# Patient Record
Sex: Male | Born: 1995 | Race: Black or African American | Hispanic: No | Marital: Single | Smoking: Current some day smoker
Health system: Southern US, Community
[De-identification: ages and names within clinical notes are randomized; demographics above are authoritative.]

## PROBLEM LIST (undated history)

## (undated) DIAGNOSIS — Z9889 Other specified postprocedural states: Secondary | ICD-10-CM

## (undated) DIAGNOSIS — R112 Nausea with vomiting, unspecified: Secondary | ICD-10-CM

## (undated) DIAGNOSIS — T8859XA Other complications of anesthesia, initial encounter: Secondary | ICD-10-CM

## (undated) DIAGNOSIS — Z945 Skin transplant status: Secondary | ICD-10-CM

## (undated) DIAGNOSIS — T4145XA Adverse effect of unspecified anesthetic, initial encounter: Secondary | ICD-10-CM

## (undated) DIAGNOSIS — J45909 Unspecified asthma, uncomplicated: Secondary | ICD-10-CM

---

## 2016-02-22 ENCOUNTER — Emergency Department (HOSPITAL_COMMUNITY): Payer: Medicaid Other

## 2016-02-22 ENCOUNTER — Inpatient Hospital Stay (HOSPITAL_COMMUNITY)
Admission: EM | Admit: 2016-02-22 | Discharge: 2016-02-25 | DRG: 493 | Disposition: A | Discharge status: Home / Self Care | Payer: Medicaid Other | Attending: Orthopedic Surgery | Admitting: Orthopedic Surgery

## 2016-02-22 ENCOUNTER — Encounter (HOSPITAL_COMMUNITY): Payer: Self-pay | Admitting: *Deleted

## 2016-02-22 DIAGNOSIS — D62 Acute posthemorrhagic anemia: Secondary | ICD-10-CM | POA: Diagnosis not present

## 2016-02-22 DIAGNOSIS — S82201B Unspecified fracture of shaft of right tibia, initial encounter for open fracture type I or II: Principal | ICD-10-CM | POA: Diagnosis present

## 2016-02-22 DIAGNOSIS — S82401B Unspecified fracture of shaft of right fibula, initial encounter for open fracture type I or II: Secondary | ICD-10-CM

## 2016-02-22 DIAGNOSIS — Z79899 Other long term (current) drug therapy: Secondary | ICD-10-CM

## 2016-02-22 DIAGNOSIS — S82201K Unspecified fracture of shaft of right tibia, subsequent encounter for closed fracture with nonunion: Secondary | ICD-10-CM

## 2016-02-22 DIAGNOSIS — W1789XA Other fall from one level to another, initial encounter: Secondary | ICD-10-CM | POA: Diagnosis present

## 2016-02-22 DIAGNOSIS — F17200 Nicotine dependence, unspecified, uncomplicated: Secondary | ICD-10-CM | POA: Diagnosis present

## 2016-02-22 DIAGNOSIS — Z419 Encounter for procedure for purposes other than remedying health state, unspecified: Secondary | ICD-10-CM

## 2016-02-22 DIAGNOSIS — S82209A Unspecified fracture of shaft of unspecified tibia, initial encounter for closed fracture: Secondary | ICD-10-CM | POA: Diagnosis present

## 2016-02-22 HISTORY — DX: Unspecified asthma, uncomplicated: J45.909

## 2016-02-22 HISTORY — DX: Adverse effect of unspecified anesthetic, initial encounter: T41.45XA

## 2016-02-22 HISTORY — DX: Skin transplant status: Z94.5

## 2016-02-22 HISTORY — DX: Other complications of anesthesia, initial encounter: T88.59XA

## 2016-02-22 HISTORY — DX: Other specified postprocedural states: Z98.890

## 2016-02-22 HISTORY — DX: Nausea with vomiting, unspecified: R11.2

## 2016-02-22 LAB — CBC WITH DIFFERENTIAL/PLATELET
BASOS ABS: 0 10*3/uL (ref 0.0–0.1)
BASOS PCT: 0 %
EOS ABS: 0 10*3/uL (ref 0.0–0.7)
Eosinophils Relative: 0 %
HEMATOCRIT: 40.5 % (ref 39.0–52.0)
HEMOGLOBIN: 13.9 g/dL (ref 13.0–17.0)
Lymphocytes Relative: 19 %
Lymphs Abs: 1.5 10*3/uL (ref 0.7–4.0)
MCH: 29.7 pg (ref 26.0–34.0)
MCHC: 34.3 g/dL (ref 30.0–36.0)
MCV: 86.5 fL (ref 78.0–100.0)
Monocytes Absolute: 0.7 10*3/uL (ref 0.1–1.0)
Monocytes Relative: 9 %
NEUTROS ABS: 5.5 10*3/uL (ref 1.7–7.7)
NEUTROS PCT: 72 %
Platelets: 227 10*3/uL (ref 150–400)
RBC: 4.68 MIL/uL (ref 4.22–5.81)
RDW: 12.6 % (ref 11.5–15.5)
WBC: 7.7 10*3/uL (ref 4.0–10.5)

## 2016-02-22 LAB — BASIC METABOLIC PANEL
ANION GAP: 6 (ref 5–15)
BUN: <5 mg/dL — ABNORMAL LOW (ref 6–20)
CALCIUM: 8.9 mg/dL (ref 8.9–10.3)
CO2: 28 mmol/L (ref 22–32)
Chloride: 102 mmol/L (ref 101–111)
Creatinine, Ser: 1.19 mg/dL (ref 0.61–1.24)
Glucose, Bld: 119 mg/dL — ABNORMAL HIGH (ref 65–99)
Potassium: 3.2 mmol/L — ABNORMAL LOW (ref 3.5–5.1)
SODIUM: 136 mmol/L (ref 135–145)

## 2016-02-22 MED ORDER — CEFAZOLIN SODIUM 1-5 GM-% IV SOLN
1.0000 g | Freq: Once | INTRAVENOUS | Status: AC
  Administered 2016-02-22: 1 g via INTRAVENOUS
  Filled 2016-02-22: qty 50

## 2016-02-22 MED ORDER — SODIUM CHLORIDE 0.9 % IV BOLUS (SEPSIS)
1000.0000 mL | Freq: Once | INTRAVENOUS | Status: AC
  Administered 2016-02-22: 1000 mL via INTRAVENOUS

## 2016-02-22 MED ORDER — HYDROMORPHONE HCL 1 MG/ML IJ SOLN
1.0000 mg | Freq: Once | INTRAMUSCULAR | Status: AC
  Administered 2016-02-22: 1 mg via INTRAVENOUS
  Filled 2016-02-22: qty 1

## 2016-02-22 MED ORDER — TETANUS-DIPHTH-ACELL PERTUSSIS 5-2.5-18.5 LF-MCG/0.5 IM SUSP
0.5000 mL | Freq: Once | INTRAMUSCULAR | Status: AC
  Administered 2016-02-22: 0.5 mL via INTRAMUSCULAR
  Filled 2016-02-22: qty 0.5
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
## 2016-02-22 NOTE — Progress Notes (Signed)
Orthopedic Tech Progress Note Patient Details:  Gregory MulberryJashaun Huerta 16-Nov-1995 [id number]  Ortho Devices Type of Ortho Device: Huerta (long leg) splint Ortho Device/Splint Location: rle Huerta long leg splint Ortho Device/Splint Interventions: Ordered, Application Assisted dr to apply posterior long leg splint. Dr stated stirrups not neccessary.   Gregory PostMartinez, Lesleyanne Politte J 02/22/2016, 11:16 PM
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
## 2016-02-23 ENCOUNTER — Inpatient Hospital Stay (HOSPITAL_COMMUNITY): Payer: Medicaid Other

## 2016-02-23 ENCOUNTER — Inpatient Hospital Stay (HOSPITAL_COMMUNITY): Payer: Medicaid Other | Admitting: Certified Registered Nurse Anesthetist

## 2016-02-23 ENCOUNTER — Encounter (HOSPITAL_COMMUNITY)
Admission: EM | Disposition: A | Discharge status: Home / Self Care | Payer: Self-pay | Source: Home / Self Care | Attending: Orthopedic Surgery

## 2016-02-23 DIAGNOSIS — S82209A Unspecified fracture of shaft of unspecified tibia, initial encounter for closed fracture: Secondary | ICD-10-CM | POA: Diagnosis present

## 2016-02-23 HISTORY — PX: TIBIA IM NAIL INSERTION: SHX2516

## 2016-02-23 HISTORY — PX: IM NAILING TIBIA: SUR734

## 2016-02-23 HISTORY — PX: INCISION AND DRAINAGE OF WOUND: SHX1803

## 2016-02-23 LAB — SURGICAL PCR SCREEN
MRSA, PCR: NEGATIVE
STAPHYLOCOCCUS AUREUS: NEGATIVE

## 2016-02-23 SURGERY — INSERTION, INTRAMEDULLARY ROD, TIBIA
Anesthesia: General | Site: Leg Lower | Laterality: Right

## 2016-02-23 MED ORDER — LACTATED RINGERS IV SOLN
INTRAVENOUS | Status: DC | PRN
  Administered 2016-02-23 (×2): via INTRAVENOUS

## 2016-02-23 MED ORDER — ACETAMINOPHEN 325 MG PO TABS: 650.0000 mg | ORAL_TABLET | Freq: Four times a day (QID) | ORAL | Status: DC | PRN

## 2016-02-23 MED ORDER — DIPHENHYDRAMINE HCL 12.5 MG/5ML PO ELIX: 12.5000 mg | ORAL_SOLUTION | ORAL | Status: DC | PRN

## 2016-02-23 MED ORDER — ROCURONIUM BROMIDE 100 MG/10ML IV SOLN
INTRAVENOUS | Status: DC | PRN
  Administered 2016-02-23: 10 mg via INTRAVENOUS
  Administered 2016-02-23: 50 mg via INTRAVENOUS
  Administered 2016-02-23: 20 mg via INTRAVENOUS

## 2016-02-23 MED ORDER — CEFAZOLIN SODIUM 1-5 GM-% IV SOLN
1.0000 g | Freq: Four times a day (QID) | INTRAVENOUS | Status: AC
  Administered 2016-02-23 – 2016-02-24 (×3): 1 g via INTRAVENOUS
  Filled 2016-02-23 (×3): qty 50

## 2016-02-23 MED ORDER — PROPOFOL 10 MG/ML IV BOLUS
INTRAVENOUS | Status: AC
  Filled 2016-02-23: qty 20

## 2016-02-23 MED ORDER — FENTANYL CITRATE (PF) 100 MCG/2ML IJ SOLN
INTRAMUSCULAR | Status: DC | PRN
  Administered 2016-02-23 (×3): 50 ug via INTRAVENOUS
  Administered 2016-02-23: 100 ug via INTRAVENOUS

## 2016-02-23 MED ORDER — POLYETHYLENE GLYCOL 3350 17 G PO PACK: 17.0000 g | PACK | Freq: Every day | ORAL | Status: DC | PRN

## 2016-02-23 MED ORDER — ACETAMINOPHEN 650 MG RE SUPP
650.0000 mg | Freq: Four times a day (QID) | RECTAL | Status: DC | PRN
Start: 2016-02-23 — End: 2016-02-23

## 2016-02-23 MED ORDER — CEFAZOLIN SODIUM-DEXTROSE 2-4 GM/100ML-% IV SOLN
2.0000 g | Freq: Three times a day (TID) | INTRAVENOUS | Status: AC
  Administered 2016-02-23: 2 g via INTRAVENOUS
  Filled 2016-02-23: qty 100

## 2016-02-23 MED ORDER — DEXTROSE-NACL 5-0.45 % IV SOLN: 100.0000 mL/h | INTRAVENOUS | Status: DC

## 2016-02-23 MED ORDER — MIDAZOLAM HCL 2 MG/2ML IJ SOLN
INTRAMUSCULAR | Status: AC
  Filled 2016-02-23: qty 2

## 2016-02-23 MED ORDER — SUGAMMADEX SODIUM 200 MG/2ML IV SOLN
INTRAVENOUS | Status: DC | PRN
  Administered 2016-02-23: 150 mg via INTRAVENOUS

## 2016-02-23 MED ORDER — HYDROMORPHONE HCL 1 MG/ML IJ SOLN
0.2500 mg | INTRAMUSCULAR | Status: DC | PRN
  Administered 2016-02-23 (×2): 0.5 mg via INTRAVENOUS

## 2016-02-23 MED ORDER — ONDANSETRON HCL 4 MG/2ML IJ SOLN
INTRAMUSCULAR | Status: AC
  Filled 2016-02-23: qty 2

## 2016-02-23 MED ORDER — ACETAMINOPHEN 500 MG PO TABS
1000.0000 mg | ORAL_TABLET | Freq: Once | ORAL | Status: DC
  Filled 2016-02-23: qty 2

## 2016-02-23 MED ORDER — SODIUM CHLORIDE 0.9 % IR SOLN
Status: DC | PRN
  Administered 2016-02-23: 3000 mL

## 2016-02-23 MED ORDER — CEFAZOLIN SODIUM-DEXTROSE 2-4 GM/100ML-% IV SOLN
2.0000 g | INTRAVENOUS | Status: AC
  Filled 2016-02-23: qty 100

## 2016-02-23 MED ORDER — MAGNESIUM CITRATE PO SOLN
1.0000 | Freq: Once | ORAL | Status: DC | PRN
Start: 2016-02-23 — End: 2016-02-23

## 2016-02-23 MED ORDER — ONDANSETRON HCL 4 MG/2ML IJ SOLN
INTRAMUSCULAR | Status: DC | PRN
  Administered 2016-02-23: 4 mg via INTRAVENOUS

## 2016-02-23 MED ORDER — SUGAMMADEX SODIUM 200 MG/2ML IV SOLN
INTRAVENOUS | Status: AC
  Filled 2016-02-23: qty 2

## 2016-02-23 MED ORDER — HYDROMORPHONE HCL 1 MG/ML IJ SOLN
1.0000 mg | INTRAMUSCULAR | Status: DC | PRN
  Administered 2016-02-23 (×2): 1 mg via INTRAVENOUS
  Filled 2016-02-23 (×2): qty 1

## 2016-02-23 MED ORDER — ASPIRIN 325 MG PO TABS
325.0000 mg | ORAL_TABLET | Freq: Every day | ORAL | Status: DC
  Administered 2016-02-23 – 2016-02-25 (×3): 325 mg via ORAL
  Filled 2016-02-23 (×4): qty 1

## 2016-02-23 MED ORDER — SODIUM CHLORIDE 0.9 % IV SOLN
INTRAVENOUS | Status: DC
  Administered 2016-02-23 – 2016-02-24 (×2): via INTRAVENOUS

## 2016-02-23 MED ORDER — METOCLOPRAMIDE HCL 5 MG PO TABS
5.0000 mg | ORAL_TABLET | Freq: Three times a day (TID) | ORAL | Status: DC | PRN
  Administered 2016-02-24: 10 mg via ORAL
  Filled 2016-02-23: qty 2

## 2016-02-23 MED ORDER — FENTANYL CITRATE (PF) 250 MCG/5ML IJ SOLN
INTRAMUSCULAR | Status: AC
  Filled 2016-02-23: qty 5

## 2016-02-23 MED ORDER — HYDROMORPHONE HCL 1 MG/ML IJ SOLN
INTRAMUSCULAR | Status: AC
  Filled 2016-02-23: qty 1

## 2016-02-23 MED ORDER — CEFAZOLIN SODIUM-DEXTROSE 2-3 GM-% IV SOLR
INTRAVENOUS | Status: DC | PRN
  Administered 2016-02-23: 2 g via INTRAVENOUS

## 2016-02-23 MED ORDER — METHOCARBAMOL 1000 MG/10ML IJ SOLN
500.0000 mg | Freq: Four times a day (QID) | INTRAVENOUS | Status: DC | PRN
  Filled 2016-02-23: qty 5

## 2016-02-23 MED ORDER — CHLORHEXIDINE GLUCONATE 4 % EX LIQD
60.0000 mL | Freq: Once | CUTANEOUS | Status: DC
  Filled 2016-02-23: qty 60

## 2016-02-23 MED ORDER — ACETAMINOPHEN 500 MG PO TABS
1000.0000 mg | ORAL_TABLET | Freq: Four times a day (QID) | ORAL | Status: AC
  Administered 2016-02-23 – 2016-02-24 (×3): 1000 mg via ORAL
  Filled 2016-02-23 (×2): qty 2

## 2016-02-23 MED ORDER — MIDAZOLAM HCL 5 MG/5ML IJ SOLN
INTRAMUSCULAR | Status: DC | PRN
  Administered 2016-02-23: 2 mg via INTRAVENOUS

## 2016-02-23 MED ORDER — POVIDONE-IODINE 10 % EX SWAB
2.0000 | Freq: Once | CUTANEOUS | Status: DC
Start: 2016-02-23 — End: 2016-02-25

## 2016-02-23 MED ORDER — BISACODYL 10 MG RE SUPP: 10.0000 mg | Freq: Every day | RECTAL | Status: DC | PRN

## 2016-02-23 MED ORDER — OXYCODONE HCL 5 MG PO TABS
5.0000 mg | ORAL_TABLET | ORAL | Status: DC | PRN
  Administered 2016-02-23: 10 mg via ORAL
  Filled 2016-02-23: qty 2

## 2016-02-23 MED ORDER — METOCLOPRAMIDE HCL 5 MG/ML IJ SOLN: 5.0000 mg | Freq: Three times a day (TID) | INTRAMUSCULAR | Status: DC | PRN

## 2016-02-23 MED ORDER — MORPHINE SULFATE (PF) 2 MG/ML IV SOLN
2.0000 mg | INTRAVENOUS | Status: DC | PRN
  Administered 2016-02-23 (×2): 2 mg via INTRAVENOUS
  Filled 2016-02-23 (×3): qty 1

## 2016-02-23 MED ORDER — LIDOCAINE HCL (CARDIAC) 20 MG/ML IV SOLN
INTRAVENOUS | Status: DC | PRN
  Administered 2016-02-23: 80 mg via INTRAVENOUS

## 2016-02-23 MED ORDER — BUPIVACAINE-EPINEPHRINE 0.5% -1:200000 IJ SOLN
INTRAMUSCULAR | Status: DC | PRN
  Administered 2016-02-23: 20 mL

## 2016-02-23 MED ORDER — 0.9 % SODIUM CHLORIDE (POUR BTL) OPTIME
TOPICAL | Status: DC | PRN
  Administered 2016-02-23: 1000 mL

## 2016-02-23 MED ORDER — OXYCODONE HCL 5 MG PO TABS
5.0000 mg | ORAL_TABLET | ORAL | Status: DC | PRN
  Administered 2016-02-23 – 2016-02-24 (×3): 10 mg via ORAL
  Filled 2016-02-23 (×4): qty 2

## 2016-02-23 MED ORDER — ACETAMINOPHEN 650 MG RE SUPP: 650.0000 mg | Freq: Four times a day (QID) | RECTAL | Status: DC | PRN

## 2016-02-23 MED ORDER — DOCUSATE SODIUM 100 MG PO CAPS
100.0000 mg | ORAL_CAPSULE | Freq: Two times a day (BID) | ORAL | Status: DC
  Administered 2016-02-23 – 2016-02-25 (×4): 100 mg via ORAL
  Filled 2016-02-23 (×4): qty 1

## 2016-02-23 MED ORDER — KETOROLAC TROMETHAMINE 15 MG/ML IJ SOLN
15.0000 mg | Freq: Four times a day (QID) | INTRAMUSCULAR | Status: DC
  Administered 2016-02-23 – 2016-02-24 (×3): 15 mg via INTRAVENOUS
  Filled 2016-02-23 (×4): qty 1

## 2016-02-23 MED ORDER — SENNA 8.6 MG PO TABS
1.0000 | ORAL_TABLET | Freq: Two times a day (BID) | ORAL | Status: DC
  Administered 2016-02-23 – 2016-02-25 (×3): 8.6 mg via ORAL
  Filled 2016-02-23 (×4): qty 1

## 2016-02-23 MED ORDER — METHOCARBAMOL 500 MG PO TABS
500.0000 mg | ORAL_TABLET | Freq: Four times a day (QID) | ORAL | Status: DC | PRN
  Administered 2016-02-23 – 2016-02-24 (×4): 500 mg via ORAL
  Filled 2016-02-23 (×4): qty 1

## 2016-02-23 MED ORDER — ONDANSETRON HCL 4 MG/2ML IJ SOLN
4.0000 mg | Freq: Four times a day (QID) | INTRAMUSCULAR | Status: DC | PRN
  Administered 2016-02-24: 4 mg via INTRAVENOUS
  Filled 2016-02-23: qty 2

## 2016-02-23 MED ORDER — PROPOFOL 10 MG/ML IV BOLUS
INTRAVENOUS | Status: DC | PRN
  Administered 2016-02-23: 150 mg via INTRAVENOUS

## 2016-02-23 MED ORDER — ZOLPIDEM TARTRATE 5 MG PO TABS: 5.0000 mg | ORAL_TABLET | Freq: Every evening | ORAL | Status: DC | PRN

## 2016-02-23 MED ORDER — ASPIRIN 325 MG PO TABS
325.0000 mg | ORAL_TABLET | Freq: Every day | ORAL | Status: DC
  Filled 2016-02-23: qty 1

## 2016-02-23 MED ORDER — ONDANSETRON HCL 4 MG PO TABS: 4.0000 mg | ORAL_TABLET | Freq: Four times a day (QID) | ORAL | Status: DC | PRN

## 2016-02-23 SURGICAL SUPPLY — 78 items
BANDAGE ELASTIC 4 VELCRO ST LF (GAUZE/BANDAGES/DRESSINGS) ×3 IMPLANT
BANDAGE ELASTIC 6 VELCRO ST LF (GAUZE/BANDAGES/DRESSINGS) ×3 IMPLANT
BIT DRILL AO GAMMA 4.2X130 (BIT) ×3 IMPLANT
BIT DRILL AO GAMMA 4.2X340 (BIT) ×3 IMPLANT
BLADE SURG 10 STRL SS (BLADE) IMPLANT
BNDG COHESIVE 4X5 TAN STRL (GAUZE/BANDAGES/DRESSINGS) IMPLANT
BNDG GAUZE ELAST 4 BULKY (GAUZE/BANDAGES/DRESSINGS) IMPLANT
CHLORAPREP W/TINT 26ML (MISCELLANEOUS) ×3 IMPLANT
COVER MAYO STAND STRL (DRAPES) IMPLANT
COVER SURGICAL LIGHT HANDLE (MISCELLANEOUS) ×3 IMPLANT
CUFF TOURNIQUET SINGLE 34IN LL (TOURNIQUET CUFF) IMPLANT
CUFF TOURNIQUET SINGLE 44IN (TOURNIQUET CUFF) IMPLANT
DRAPE C-ARM 42X72 X-RAY (DRAPES) ×3 IMPLANT
DRAPE C-ARMOR (DRAPES) ×3 IMPLANT
DRAPE IMP U-DRAPE 54X76 (DRAPES) ×3 IMPLANT
DRAPE PROXIMA HALF (DRAPES) ×3 IMPLANT
DRESSING OPSITE X SMALL 2X3 (GAUZE/BANDAGES/DRESSINGS) IMPLANT
DRSG ADAPTIC 3X8 NADH LF (GAUZE/BANDAGES/DRESSINGS) IMPLANT
DRSG EMULSION OIL 3X3 NADH (GAUZE/BANDAGES/DRESSINGS) ×3 IMPLANT
DRSG MEPITEL 3X4 ME34 (GAUZE/BANDAGES/DRESSINGS) ×3 IMPLANT
DRSG PAD ABDOMINAL 8X10 ST (GAUZE/BANDAGES/DRESSINGS) ×3 IMPLANT
DRSG TEGADERM 4X4.75 (GAUZE/BANDAGES/DRESSINGS) IMPLANT
ELECT CAUTERY BLADE 6.4 (BLADE) IMPLANT
ELECT REM PT RETURN 9FT ADLT (ELECTROSURGICAL) ×3
ELECTRODE REM PT RTRN 9FT ADLT (ELECTROSURGICAL) ×1 IMPLANT
GAUZE SPONGE 4X4 12PLY STRL (GAUZE/BANDAGES/DRESSINGS) ×3 IMPLANT
GAUZE XEROFORM 1X8 LF (GAUZE/BANDAGES/DRESSINGS) ×3 IMPLANT
GLOVE BIO SURGEON STRL SZ7 (GLOVE) IMPLANT
GLOVE BIO SURGEON STRL SZ7.5 (GLOVE) ×6 IMPLANT
GLOVE BIO SURGEON STRL SZ8.5 (GLOVE) ×3 IMPLANT
GLOVE BIOGEL PI IND STRL 7.0 (GLOVE) ×1 IMPLANT
GLOVE BIOGEL PI IND STRL 7.5 (GLOVE) ×1 IMPLANT
GLOVE BIOGEL PI IND STRL 8 (GLOVE) ×2 IMPLANT
GLOVE BIOGEL PI INDICATOR 7.0 (GLOVE) ×2
GLOVE BIOGEL PI INDICATOR 7.5 (GLOVE) ×2
GLOVE BIOGEL PI INDICATOR 8 (GLOVE) ×4
GLOVE SURG SS PI 6.5 STRL IVOR (GLOVE) ×3 IMPLANT
GOWN STRL REUS W/ TWL LRG LVL3 (GOWN DISPOSABLE) ×4 IMPLANT
GOWN STRL REUS W/TWL LRG LVL3 (GOWN DISPOSABLE) ×8
GUIDEWIRE GAMMA (WIRE) ×3 IMPLANT
GUIDEWIRE GAMMA 800 (WIRE) ×3 IMPLANT
KIT BASIN OR (CUSTOM PROCEDURE TRAY) ×3 IMPLANT
KIT ROOM TURNOVER OR (KITS) ×3 IMPLANT
MANIFOLD NEPTUNE II (INSTRUMENTS) ×3 IMPLANT
NAIL TIBIAL STND 9X375MM (Nail) ×3 IMPLANT
NAIL TIBIALSTD 9X375MM (Nail) ×1 IMPLANT
NEEDLE 22X1 1/2 (OR ONLY) (NEEDLE) ×3 IMPLANT
NS IRRIG 1000ML POUR BTL (IV SOLUTION) ×3 IMPLANT
PACK GENERAL/GYN (CUSTOM PROCEDURE TRAY) ×3 IMPLANT
PACK ORTHO EXTREMITY (CUSTOM PROCEDURE TRAY) ×3 IMPLANT
PACK UNIVERSAL I (CUSTOM PROCEDURE TRAY) IMPLANT
PAD ARMBOARD 7.5X6 YLW CONV (MISCELLANEOUS) ×6 IMPLANT
PAD CAST 4YDX4 CTTN HI CHSV (CAST SUPPLIES) IMPLANT
PADDING CAST COTTON 4X4 STRL (CAST SUPPLIES)
REAMER SHAFT BIXCUT (INSTRUMENTS) ×3 IMPLANT
SCREW GAMMA (Screw) ×3 IMPLANT
SCREW LOCKING T2 F/T  5MMX50MM (Screw) ×2 IMPLANT
SCREW LOCKING T2 F/T  5X37.5MM (Screw) ×2 IMPLANT
SCREW LOCKING T2 F/T  5X42.5MM (Screw) ×2 IMPLANT
SCREW LOCKING T2 F/T 5MMX50MM (Screw) ×1 IMPLANT
SCREW LOCKING T2 F/T 5X37.5MM (Screw) ×1 IMPLANT
SCREW LOCKING T2 F/T 5X42.5MM (Screw) ×1 IMPLANT
SET CYSTO W/LG BORE CLAMP LF (SET/KITS/TRAYS/PACK) ×3 IMPLANT
SPONGE LAP 18X18 X RAY DECT (DISPOSABLE) IMPLANT
STAPLER VISISTAT 35W (STAPLE) IMPLANT
SUT ETHILON 3 0 PS 1 (SUTURE) ×9 IMPLANT
SUT MNCRL AB 4-0 PS2 18 (SUTURE) IMPLANT
SUT MON AB 2-0 CT1 27 (SUTURE) IMPLANT
SUT VIC AB 0 CT1 27 (SUTURE) ×2
SUT VIC AB 0 CT1 27XBRD ANBCTR (SUTURE) ×1 IMPLANT
SYR BULB IRRIGATION 50ML (SYRINGE) IMPLANT
SYR CONTROL 10ML LL (SYRINGE) ×3 IMPLANT
TOWEL OR 17X24 6PK STRL BLUE (TOWEL DISPOSABLE) ×3 IMPLANT
TOWEL OR 17X26 10 PK STRL BLUE (TOWEL DISPOSABLE) ×3 IMPLANT
TOWEL OR NON WOVEN STRL DISP B (DISPOSABLE) IMPLANT
TUBE CONNECTING 12'X1/4 (SUCTIONS)
TUBE CONNECTING 12X1/4 (SUCTIONS) IMPLANT
YANKAUER SUCT BULB TIP NO VENT (SUCTIONS) ×3 IMPLANT

## 2016-02-23 NOTE — Anesthesia Preprocedure Evaluation (Addendum)
Anesthesia Evaluation  Patient identified by MRN, date of birth, ID band Patient awake    Reviewed: Allergy & Precautions, H&P , NPO status , Patient's Chart, lab work & pertinent test results  Airway Mallampati: I  TM Distance: >3 FB Neck ROM: Full    Dental no notable dental hx. (+) Teeth Intact, Dental Advisory Given   Pulmonary neg pulmonary ROS, Current Smoker,    Pulmonary exam normal breath sounds clear to auscultation       Cardiovascular negative cardio ROS   Rhythm:Regular Rate:Normal     Neuro/Psych negative neurological ROS  negative psych ROS   GI/Hepatic negative GI ROS, Neg liver ROS,   Endo/Other  negative endocrine ROS  Renal/GU negative Renal ROS  negative genitourinary   Musculoskeletal   Abdominal   Peds  Hematology negative hematology ROS (+)   Anesthesia Other Findings   Reproductive/Obstetrics negative OB ROS                            Anesthesia Physical Anesthesia Plan  ASA: I  Anesthesia Plan: General   Post-op Pain Management:    Induction: Intravenous, Rapid sequence and Cricoid pressure planned  Airway Management Planned: Oral ETT  Additional Equipment:   Intra-op Plan:   Post-operative Plan: Extubation in OR  Informed Consent: I have reviewed the patients History and Physical, chart, labs and discussed the procedure including the risks, benefits and alternatives for the proposed anesthesia with the patient or authorized representative who has indicated his/her understanding and acceptance.   Dental advisory given  Plan Discussed with: CRNA  Anesthesia Plan Comments:         Anesthesia Quick Evaluation
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
## 2016-02-23 NOTE — Addendum Note (Signed)
Addendum  created 02/23/16 1552 by Jodell CiproSarah A Carra Brindley, CRNA   Modules edited: Charges VN
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
## 2016-02-23 NOTE — Progress Notes (Signed)
Officer Suzie Portelaayne has seen pt and removed his personal belongings.

## 2016-02-23 NOTE — Anesthesia Procedure Notes (Signed)
Procedure Name: Intubation Date/Time: 02/23/2016 8:25 AM Performed by: Adonis HousekeeperNGELL, Ellyssa Zagal M Pre-anesthesia Checklist: Patient identified, Emergency Drugs available, Suction available and Patient being monitored Patient Re-evaluated:Patient Re-evaluated prior to inductionOxygen Delivery Method: Circle system utilized Preoxygenation: Pre-oxygenation with 100% oxygen Intubation Type: IV induction Ventilation: Mask ventilation without difficulty Laryngoscope Size: Mac and 4 Grade View: Grade I Tube type: Oral Tube size: 7.5 mm Number of attempts: 1 Airway Equipment and Method: Stylet Placement Confirmation: ETT inserted through vocal cords under direct vision,  positive ETCO2 and breath sounds checked- equal and bilateral Secured at: 21 cm Tube secured with: Tape Dental Injury: Teeth and Oropharynx as per pre-operative assessment

## 2016-02-23 NOTE — Progress Notes (Signed)
I was called from floor re: absent admission orders for patient.  Dr. Renaye Rakersim Murphy on unassigned call for ED, I am on practice call.  I have completed admission orders, admission H&P and future questions re: orders should be directed to Dr. Renaye Rakersim Murphy.  Eulas PostLANDAU,Mackensi Mahadeo P, MD
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
## 2016-02-23 NOTE — Op Note (Signed)
02/22/2016 - 02/23/2016  9:59 AM  PATIENT:  Gregory Huerta    PRE-OPERATIVE DIAGNOSIS:  Fractured Right Tibia  POST-OPERATIVE DIAGNOSIS:  Same  PROCEDURE:  INTRAMEDULLARY (IM) NAIL TIBIAL, IRRIGATION AND DEBRIDEMENT WOUND, skin bone and muscle of open fracture site  SURGEON:  Gregory Reichardt D, MD  ASSISTANT: Gregory HackerHenry Martensen, PA-C, She was present and scrubbed throughout the case, critical for completion in a timely fashion, and for retraction, instrumentation, and closure.   ANESTHESIA:   General  PREOPERATIVE INDICATIONS:  Gregory Huerta is a  20 y.o. male with a diagnosis of Fractured Right Tibia who failed conservative measures and elected for surgical management.    The risks benefits and alternatives were discussed with the patient preoperatively including but not limited to the risks of infection, bleeding, nerve injury, cardiopulmonary complications, the need for revision surgery, among others, and the patient was willing to proceed.  OPERATIVE IMPLANTS: St[employer]ker T2 Tibial nail   BLOOD LOSS: minimal  COMPLICATIONS: none  TOURNIQUET TIME: none  OPERATIVE PROCEDURE:  Patient was identified in the preoperative holding area and site was marked by me He was transported to the operating theater and placed on the table in supine position taking care to pad all bony prominences. After a preincinduction time out anesthesia was induced. The right lower extremity was prepped and draped in normal sterile fashion and a pre-incision timeout was performed. Gregory Huerta received ancef for preoperative antibiotics.   He had a grade 1 open fracture. I extended this poke hole proximally and distally to deliver the bone and that had poked out.  I then performed a excisional debridement of skin muscle and bone using a scissors and curet and Cobb at the fracture site. I removed a few devitalized pieces of bone that were very small.  Next I irrigated this with a 3 L bag of saline. I  then closed primarily his wound with a 3-0 nylon  The leg was placed over the radiolucent triangle. I then made a 4 cm incision over the patella tendon. I dissected down and incised the patella tendon taking care to not penetrate into the joint itself.   I placed a guidewire under fluoroscopic guidance just medial to lateral tibial spine. I was happy with this placement on all views. I used the entry reamer to create a path into the proximal tibia staying out of the joint itself.   I then passed the ball tip guidewire down the tibia and across the fracture site. I held appropriate reduction confirmed on multiple views of fluoroscopy and sequentially reamed up to an appropriate size with appropriate chatter. I selected the above-sized nail and passed it down the ball-tipped guidewire and seated it completely to a was sunk into the proximal tibia.  I then used the outrigger placed a static transverse and 2 oblique proximal locking screws. As happy with her length and placement on multiple oblique fluoroscopic views.  Next I confirmed appropriate rotation of the tibia with fracture tease and orientation of the patella and toes. I then used a perfect circles technique to place a distal static interlock screw.  The wounds were then all thoroughly irrigated and closed in layers. Sterile dressing was applied and he was taken to the PACU in stable condition.  POST OPERATIVE PLAN: WBAT, DVT prophylaxis: Early ambulation, SCD's, ASA 81  This note was generated using a template and dragon dictation system. In light of that, I have reviewed the note and all aspects of it are applicable to this  case. Any dictation errors are due to the computerized dictation system.
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
## 2016-02-23 NOTE — Anesthesia Postprocedure Evaluation (Signed)
Anesthesia Post Note  Patient: [name]  Procedure(s) Performed: Procedure(s) (LRB): INTRAMEDULLARY (IM) NAIL TIBIAL (Right) IRRIGATION AND DEBRIDEMENT WOUND, skin bone and muscle of open fracture site (Right)  Patient location during evaluation: PACU Anesthesia Type: General Level of consciousness: awake and alert Pain management: pain level controlled Vital Signs Assessment: post-procedure vital signs reviewed and stable Respiratory status: spontaneous breathing, nonlabored ventilation, respiratory function stable and patient connected to nasal cannula oxygen Cardiovascular status: blood pressure returned to baseline and stable Postop Assessment: no signs of nausea or vomiting Anesthetic complications: no    Last Vitals:  Filed Vitals:   02/23/16 1140 02/23/16 1145  BP: 123/78   Pulse: 65 55  Temp:    Resp: 12 8    Last Pain:  Filed Vitals:   02/23/16 1153  PainSc: 8                  Kenadie Royce,W. EDMOND

## 2016-02-24 ENCOUNTER — Encounter (HOSPITAL_COMMUNITY): Payer: Self-pay | Admitting: General Practice

## 2016-02-24 LAB — COMPREHENSIVE METABOLIC PANEL
ALBUMIN: 3.2 g/dL — AB (ref 3.5–5.0)
ALT: 15 U/L — ABNORMAL LOW (ref 17–63)
ANION GAP: 8 (ref 5–15)
AST: 40 U/L (ref 15–41)
Alkaline Phosphatase: 41 U/L (ref 38–126)
CHLORIDE: 102 mmol/L (ref 101–111)
CO2: 27 mmol/L (ref 22–32)
Calcium: 8.5 mg/dL — ABNORMAL LOW (ref 8.9–10.3)
Creatinine, Ser: 0.87 mg/dL (ref 0.61–1.24)
GFR calc Af Amer: >60 mL/min (ref >60)
GFR calc non Af Amer: >60 mL/min (ref >60)
GLUCOSE: 106 mg/dL — AB (ref 65–99)
POTASSIUM: 3.3 mmol/L — AB (ref 3.5–5.1)
SODIUM: 137 mmol/L (ref 135–145)
Total Bilirubin: 1.8 mg/dL — ABNORMAL HIGH (ref 0.3–1.2)
Total Protein: 5.6 g/dL — ABNORMAL LOW (ref 6.5–8.1)

## 2016-02-24 LAB — CBC WITH DIFFERENTIAL/PLATELET
BASOS ABS: 0 10*3/uL (ref 0.0–0.1)
BASOS PCT: 0 %
EOS ABS: 0 10*3/uL (ref 0.0–0.7)
EOS PCT: 0 %
HCT: 33.7 % — ABNORMAL LOW (ref 39.0–52.0)
Hemoglobin: 11.4 g/dL — ABNORMAL LOW (ref 13.0–17.0)
Lymphocytes Relative: 8 %
Lymphs Abs: 1.1 10*3/uL (ref 0.7–4.0)
MCH: 29.1 pg (ref 26.0–34.0)
MCHC: 33.8 g/dL (ref 30.0–36.0)
MCV: 86 fL (ref 78.0–100.0)
MONO ABS: 1.9 10*3/uL — AB (ref 0.1–1.0)
Monocytes Relative: 14 %
NEUTROS ABS: 10.4 10*3/uL — AB (ref 1.7–7.7)
Neutrophils Relative %: 78 %
PLATELETS: 180 10*3/uL (ref 150–400)
RBC: 3.92 MIL/uL — ABNORMAL LOW (ref 4.22–5.81)
RDW: 12.6 % (ref 11.5–15.5)
WBC: 13.4 10*3/uL — ABNORMAL HIGH (ref 4.0–10.5)

## 2016-02-24 MED ORDER — POTASSIUM CHLORIDE CRYS ER 20 MEQ PO TBCR
40.0000 meq | EXTENDED_RELEASE_TABLET | Freq: Once | ORAL | Status: AC
  Administered 2016-02-24: 40 meq via ORAL
  Filled 2016-02-24: qty 2

## 2016-02-24 MED ORDER — METHOCARBAMOL 500 MG PO TABS: 500.0000 mg | ORAL_TABLET | Freq: Four times a day (QID) | ORAL | Status: DC | PRN

## 2016-02-24 MED ORDER — HYDROCODONE-ACETAMINOPHEN 5-325 MG PO TABS
1.0000 | ORAL_TABLET | ORAL | Status: DC | PRN
  Administered 2016-02-24 – 2016-02-25 (×2): 2 via ORAL
  Filled 2016-02-24 (×2): qty 2

## 2016-02-24 MED ORDER — HYDROMORPHONE HCL 1 MG/ML IJ SOLN: 0.5000 mg | INTRAMUSCULAR | Status: DC | PRN

## 2016-02-24 MED ORDER — ONDANSETRON HCL 4 MG PO TABS: 4.0000 mg | ORAL_TABLET | Freq: Three times a day (TID) | ORAL | Status: DC | PRN

## 2016-02-24 MED ORDER — OXYCODONE-ACETAMINOPHEN 5-325 MG PO TABS: 1.0000 | ORAL_TABLET | ORAL | Status: DC | PRN

## 2016-02-24 MED ORDER — KETOROLAC TROMETHAMINE 15 MG/ML IJ SOLN
15.0000 mg | Freq: Three times a day (TID) | INTRAMUSCULAR | Status: DC
  Administered 2016-02-24 – 2016-02-25 (×3): 15 mg via INTRAVENOUS
  Filled 2016-02-24 (×3): qty 1

## 2016-02-24 MED ORDER — LACTATED RINGERS IV SOLN
INTRAVENOUS | Status: DC
  Administered 2016-02-24 (×2): via INTRAVENOUS

## 2016-02-24 MED ORDER — ASPIRIN EC 325 MG PO TBEC: 325.0000 mg | DELAYED_RELEASE_TABLET | Freq: Every day | ORAL | Status: DC

## 2016-02-24 NOTE — Progress Notes (Signed)
OT Cancellation Note  Patient Details Name: Gregory Huerta MRN: [id number] DOB: 1996/06/23   Cancelled Treatment:    Reason Eval/Treat Not Completed: Pain limiting ability to participate. Pt with n/v with pain medication. Provided ice for R LE and repositioned. Will follow.  Evern BioMayberry, Aman Bonet Lynn 02/24/2016, 11:59 AM  [phone]
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
## 2016-02-24 NOTE — Progress Notes (Signed)
Orthopedic Tech Progress Note Patient Details:  Gregory Huerta [birth year] [id number]  Ortho Devices Type of Ortho Device: Postop shoe/boot Ortho Device/Splint Location: RLE foot Ortho Device/Splint Interventions: Ordered, Application   Gregory Huerta, Gregory Huerta 02/24/2016, 5:02 PM
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
## 2016-02-24 NOTE — Progress Notes (Signed)
Pt has a court appearance this morning and has requested the doctor to fax a letter to the court house explaining that Pt will not be able to make it.
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
## 2016-02-25 MED ORDER — DOCUSATE SODIUM 100 MG PO CAPS: 100.0000 mg | ORAL_CAPSULE | Freq: Two times a day (BID) | ORAL | Status: DC

## 2016-02-25 MED ORDER — OMEPRAZOLE 20 MG PO CPDR: 20.0000 mg | DELAYED_RELEASE_CAPSULE | Freq: Every day | ORAL | Status: DC

## 2016-02-25 MED ORDER — HYDROCODONE-ACETAMINOPHEN 5-325 MG PO TABS: 1.0000 | ORAL_TABLET | Freq: Four times a day (QID) | ORAL | Status: DC | PRN
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
## 2016-08-15 ENCOUNTER — Encounter: Payer: Self-pay | Admitting: Medical Oncology

## 2016-08-15 ENCOUNTER — Emergency Department
Admission: EM | Admit: 2016-08-15 | Discharge: 2016-08-15 | Disposition: A | Discharge status: Home / Self Care | Payer: Medicaid Other | Attending: Emergency Medicine | Admitting: Emergency Medicine

## 2016-08-15 ENCOUNTER — Emergency Department: Payer: Medicaid Other

## 2016-08-15 DIAGNOSIS — Z79899 Other long term (current) drug therapy: Secondary | ICD-10-CM | POA: Insufficient documentation

## 2016-08-15 DIAGNOSIS — J45909 Unspecified asthma, uncomplicated: Secondary | ICD-10-CM | POA: Insufficient documentation

## 2016-08-15 DIAGNOSIS — F17210 Nicotine dependence, cigarettes, uncomplicated: Secondary | ICD-10-CM | POA: Insufficient documentation

## 2016-08-15 DIAGNOSIS — Z7982 Long term (current) use of aspirin: Secondary | ICD-10-CM | POA: Insufficient documentation

## 2016-08-15 DIAGNOSIS — M79604 Pain in right leg: Secondary | ICD-10-CM | POA: Insufficient documentation

## 2016-08-15 MED ORDER — OXYCODONE-ACETAMINOPHEN 5-325 MG PO TABS
2.0000 | ORAL_TABLET | Freq: Once | ORAL | Status: AC
  Administered 2016-08-15: 2 via ORAL
  Filled 2016-08-15: qty 2

## 2016-08-15 MED ORDER — TRAMADOL HCL 50 MG PO TABS: 50.0000 mg | ORAL_TABLET | Freq: Four times a day (QID) | ORAL | 0 refills | Status: AC | PRN
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
## 2016-08-15 NOTE — ED Notes (Signed)
Pt ride at bedside.
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
## 2016-08-15 NOTE — Discharge Instructions (Signed)
[facility] [place].

## 2016-10-14 ENCOUNTER — Encounter: Payer: Self-pay | Admitting: Medical Oncology

## 2016-10-14 ENCOUNTER — Emergency Department
Admission: EM | Admit: 2016-10-14 | Discharge: 2016-10-14 | Disposition: A | Discharge status: Home / Self Care | Payer: Medicaid Other | Attending: Emergency Medicine | Admitting: Emergency Medicine

## 2016-10-14 DIAGNOSIS — Z7982 Long term (current) use of aspirin: Secondary | ICD-10-CM | POA: Insufficient documentation

## 2016-10-14 DIAGNOSIS — F17210 Nicotine dependence, cigarettes, uncomplicated: Secondary | ICD-10-CM | POA: Diagnosis not present

## 2016-10-14 DIAGNOSIS — J45909 Unspecified asthma, uncomplicated: Secondary | ICD-10-CM | POA: Insufficient documentation

## 2016-10-14 DIAGNOSIS — J029 Acute pharyngitis, unspecified: Secondary | ICD-10-CM

## 2016-10-14 LAB — POCT RAPID STREP A: Streptococcus, Group A Screen (Direct): NEGATIVE

## 2016-10-14 MED ORDER — MAGIC MOUTHWASH W/LIDOCAINE: 5.0000 mL | Freq: Four times a day (QID) | ORAL | 0 refills | Status: DC | PRN

## 2016-10-14 MED ORDER — PSEUDOEPH-BROMPHEN-DM 30-2-10 MG/5ML PO SYRP: 10.0000 mL | ORAL_SOLUTION | Freq: Four times a day (QID) | ORAL | 0 refills | Status: DC | PRN

## 2016-10-14 MED ORDER — IBUPROFEN 600 MG PO TABS: 600.0000 mg | ORAL_TABLET | Freq: Four times a day (QID) | ORAL | 0 refills | Status: DC | PRN

## 2016-10-14 MED ORDER — IBUPROFEN 800 MG PO TABS
800.0000 mg | ORAL_TABLET | Freq: Once | ORAL | Status: AC
  Administered 2016-10-14: 800 mg via ORAL
  Filled 2016-10-14: qty 1

## 2016-10-14 NOTE — ED Triage Notes (Addendum)
Pt reports sore throat x3-4 days. denies fever.
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
## 2016-10-15 LAB — CULTURE, GROUP A STREP (THRC)
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
## 2017-03-19 IMAGING — RF DG TIBIA/FIBULA 2V*R*
1 series · 6 of 6 positions shown · non-contrast
Comparison: Radiograph February 22, 2016.

CLINICAL DATA: Open reduction and internal fixation of right tibial
fracture.

EXAM:
RIGHT TIBIA AND FIBULA - 2 VIEW; DG C-ARM 61-120 MIN
FLUOROSCOPY TIME:  1 minutes 39 seconds.

[Series 1: run · 6 of 6 slices shown]
[im 1/6]
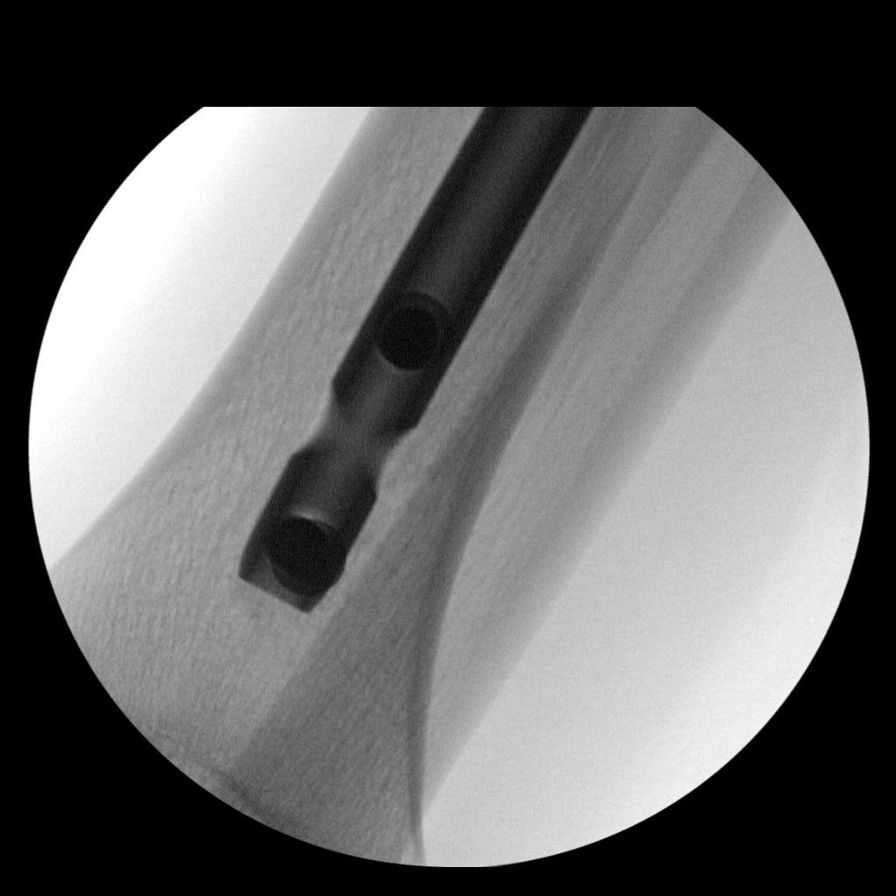
[im 2/6]
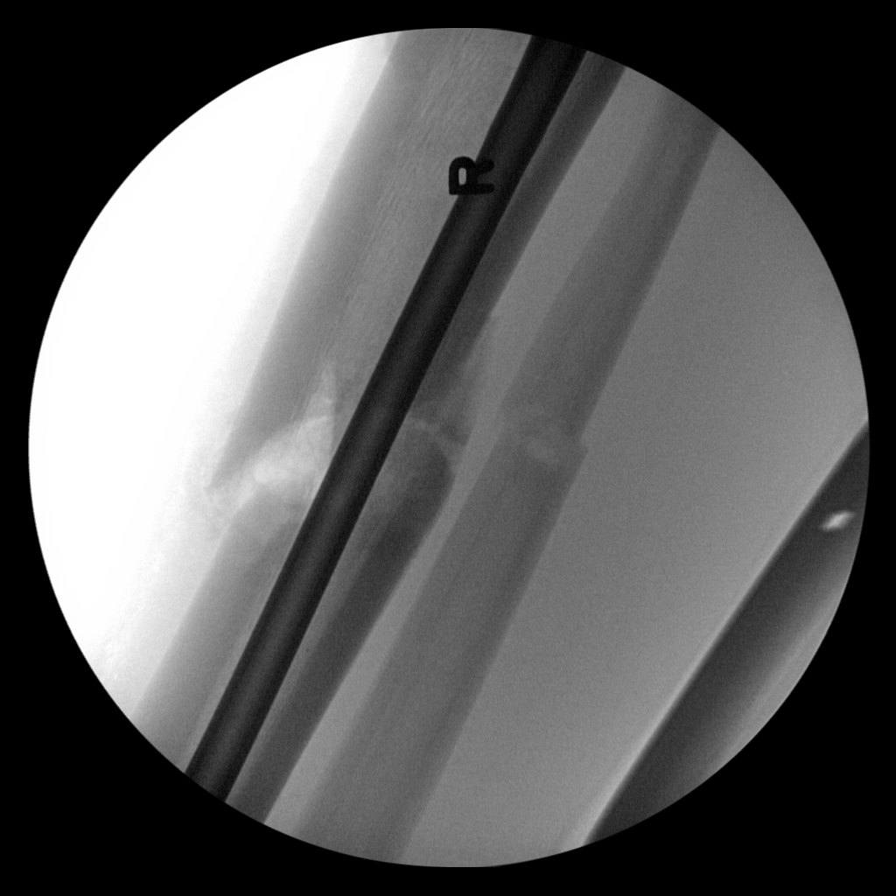
[im 3/6]
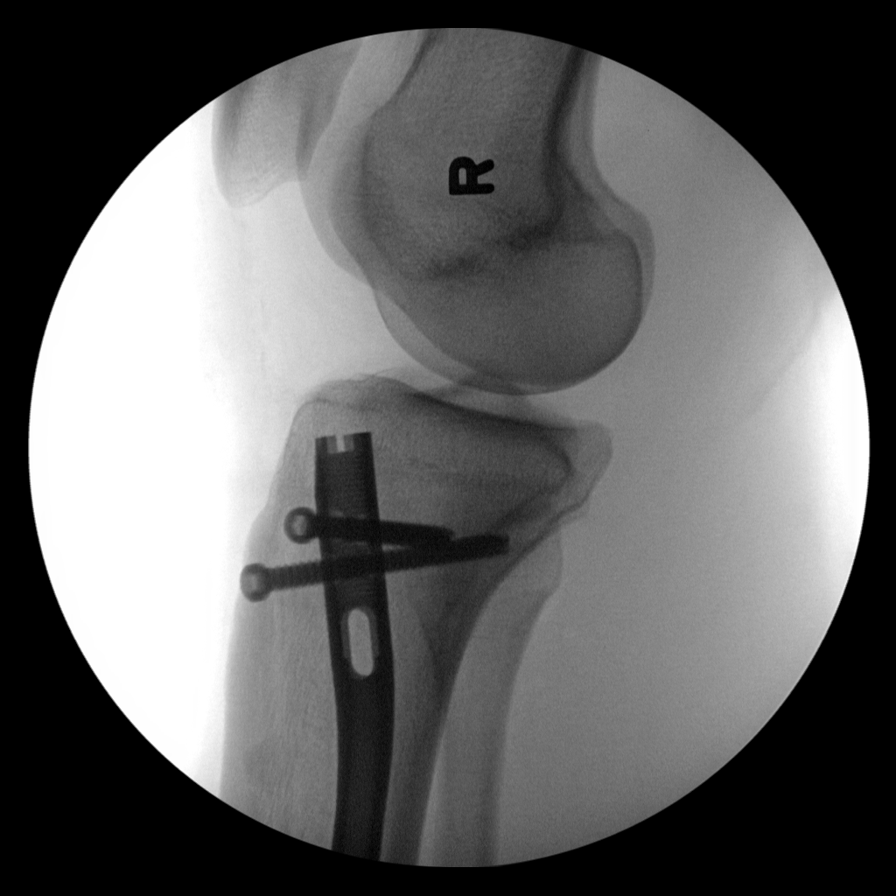
[im 4/6]
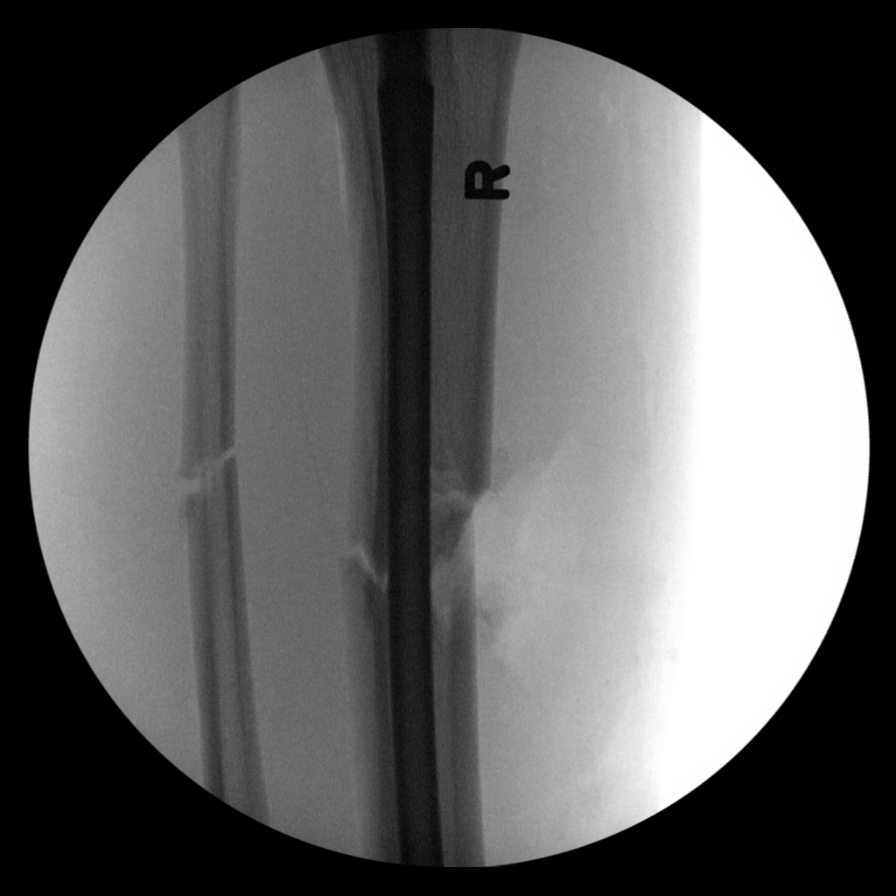
[im 5/6]
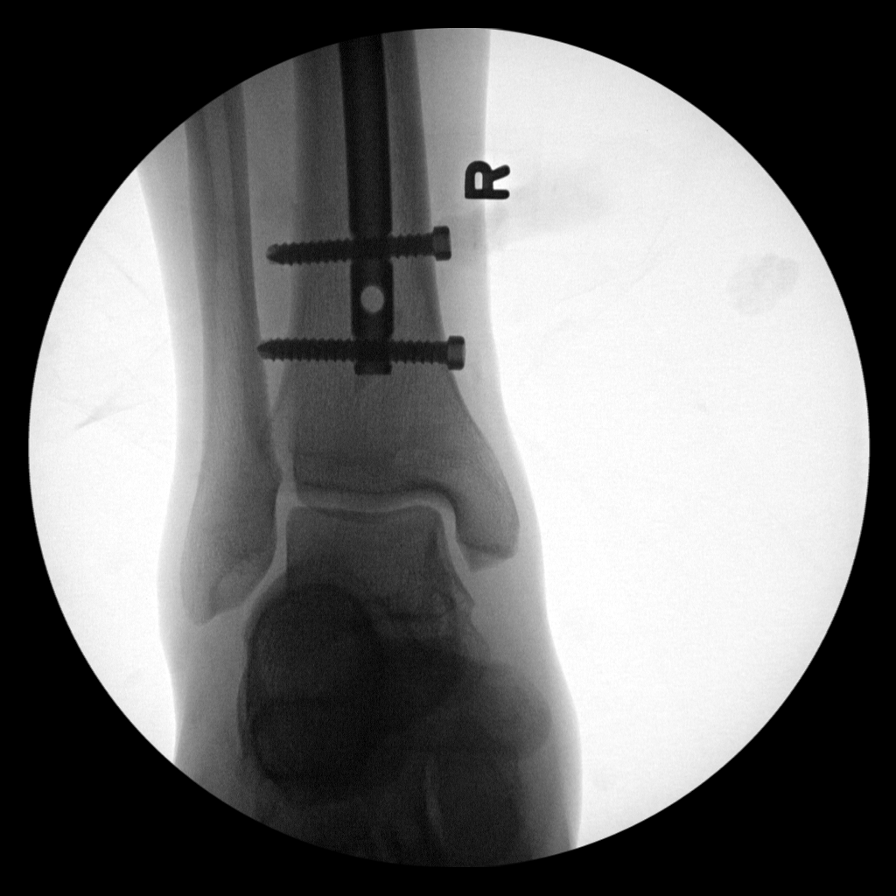
[im 6/6]
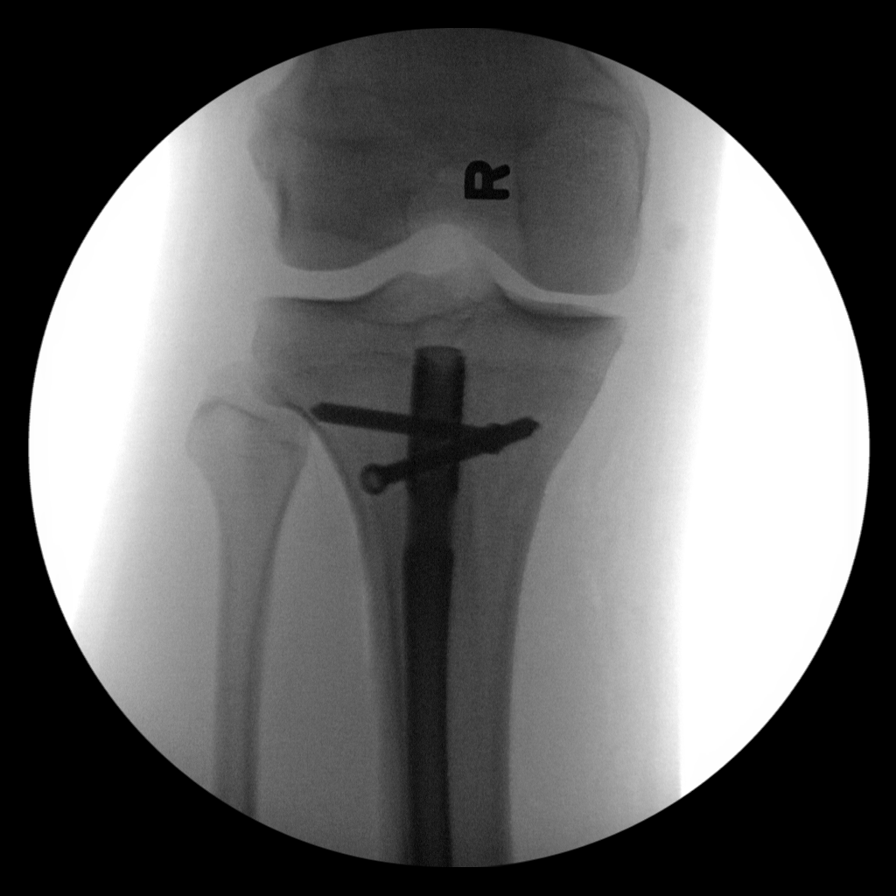

[6 of 6 positions shown; findings below may reference images not displayed]

FINDINGS: Six intraoperative fluoroscopic images of the right tibia and fibula
were obtained. These images demonstrate intra medullary rod fixation
of comminuted fracture of right tibial shaft. Good alignment of
fracture components is noted. Improved alignment of fibular fracture
is also noted.
IMPRESSION: Status post intra medullary rod fixation of comminuted right tibial
shaft fracture.

## 2017-05-02 ENCOUNTER — Other Ambulatory Visit
Admission: RE | Admit: 2017-05-02 | Discharge: 2017-05-02 | Disposition: A | Discharge status: Home / Self Care | Payer: Medicaid Other | Source: Ambulatory Visit | Attending: Infectious Diseases | Admitting: Infectious Diseases

## 2017-05-02 DIAGNOSIS — Z114 Encounter for screening for human immunodeficiency virus [HIV]: Secondary | ICD-10-CM | POA: Diagnosis not present

## 2017-05-02 LAB — RAPID HIV SCREEN (HIV 1/2 AB+AG)
HIV 1/2 Antibodies: NONREACTIVE
HIV-1 P24 Antigen - HIV24: NONREACTIVE

## 2017-06-24 ENCOUNTER — Encounter: Payer: Self-pay | Admitting: Emergency Medicine

## 2017-06-24 DIAGNOSIS — Z5321 Procedure and treatment not carried out due to patient leaving prior to being seen by health care provider: Secondary | ICD-10-CM | POA: Diagnosis not present

## 2017-06-24 DIAGNOSIS — R109 Unspecified abdominal pain: Secondary | ICD-10-CM | POA: Insufficient documentation

## 2017-06-24 DIAGNOSIS — R112 Nausea with vomiting, unspecified: Secondary | ICD-10-CM | POA: Diagnosis present

## 2017-06-24 LAB — COMPREHENSIVE METABOLIC PANEL
ALK PHOS: 60 U/L (ref 38–126)
ALT: 16 U/L — ABNORMAL LOW (ref 17–63)
ANION GAP: 11 (ref 5–15)
AST: 26 U/L (ref 15–41)
Albumin: 4.5 g/dL (ref 3.5–5.0)
BUN: 10 mg/dL (ref 6–20)
CALCIUM: 9.7 mg/dL (ref 8.9–10.3)
CO2: 26 mmol/L (ref 22–32)
CREATININE: 0.95 mg/dL (ref 0.61–1.24)
Chloride: 101 mmol/L (ref 101–111)
Glucose, Bld: 129 mg/dL — ABNORMAL HIGH (ref 65–99)
Potassium: 3.7 mmol/L (ref 3.5–5.1)
SODIUM: 138 mmol/L (ref 135–145)
Total Bilirubin: 1.5 mg/dL — ABNORMAL HIGH (ref 0.3–1.2)
Total Protein: 7.8 g/dL (ref 6.5–8.1)

## 2017-06-24 LAB — URINALYSIS, COMPLETE (UACMP) WITH MICROSCOPIC
BILIRUBIN URINE: NEGATIVE
Bacteria, UA: NONE SEEN
Glucose, UA: NEGATIVE mg/dL
Hgb urine dipstick: NEGATIVE
Ketones, ur: 80 mg/dL — AB
LEUKOCYTES UA: NEGATIVE
Nitrite: NEGATIVE
PH: 7 (ref 5.0–8.0)
Protein, ur: 100 mg/dL — AB
RBC / HPF: NONE SEEN RBC/hpf (ref 0–5)
SPECIFIC GRAVITY, URINE: 1.029 (ref 1.005–1.030)

## 2017-06-24 LAB — CBC
HCT: 45.4 % (ref 40.0–52.0)
HEMOGLOBIN: 15.1 g/dL (ref 13.0–18.0)
MCH: 29.9 pg (ref 26.0–34.0)
MCHC: 33.3 g/dL (ref 32.0–36.0)
MCV: 89.9 fL (ref 80.0–100.0)
PLATELETS: 245 10*3/uL (ref 150–440)
RBC: 5.05 MIL/uL (ref 4.40–5.90)
RDW: 13.3 % (ref 11.5–14.5)
WBC: 13.8 10*3/uL — ABNORMAL HIGH (ref 3.8–10.6)

## 2017-06-24 LAB — LIPASE, BLOOD: LIPASE: 22 U/L (ref 11–51)

## 2017-06-24 MED ORDER — ONDANSETRON 4 MG PO TBDP
4.0000 mg | ORAL_TABLET | Freq: Once | ORAL | Status: AC | PRN
  Administered 2017-06-24: 4 mg via ORAL

## 2017-06-24 NOTE — ED Triage Notes (Signed)
Patient arrived from home with co n/v today and co generalized abd pain.  Pt states he is unable to keep anything down and has tried home remedied to no avail.

## 2017-06-25 ENCOUNTER — Emergency Department
Admission: EM | Admit: 2017-06-25 | Discharge: 2017-06-25 | Disposition: A | Discharge status: Home / Self Care | Payer: Medicaid Other | Attending: Emergency Medicine | Admitting: Emergency Medicine

## 2017-06-26 ENCOUNTER — Telehealth: Payer: Self-pay | Admitting: Emergency Medicine

## 2017-06-26 NOTE — Telephone Encounter (Signed)
Called patient due to lwot to inquire about condition and follow up plans. Number not in service. 

## 2020-06-22 ENCOUNTER — Emergency Department
Admission: EM | Admit: 2020-06-22 | Discharge: 2020-06-22 | Disposition: A | Discharge status: Home / Self Care | Payer: Medicaid Other | Attending: Emergency Medicine | Admitting: Emergency Medicine

## 2020-06-22 ENCOUNTER — Other Ambulatory Visit: Payer: Self-pay

## 2020-06-22 ENCOUNTER — Encounter: Payer: Self-pay | Admitting: Emergency Medicine

## 2020-06-22 DIAGNOSIS — J45909 Unspecified asthma, uncomplicated: Secondary | ICD-10-CM | POA: Diagnosis not present

## 2020-06-22 DIAGNOSIS — Z7982 Long term (current) use of aspirin: Secondary | ICD-10-CM | POA: Insufficient documentation

## 2020-06-22 DIAGNOSIS — H6002 Abscess of left external ear: Secondary | ICD-10-CM | POA: Insufficient documentation

## 2020-06-22 DIAGNOSIS — F17210 Nicotine dependence, cigarettes, uncomplicated: Secondary | ICD-10-CM | POA: Diagnosis not present

## 2020-06-22 DIAGNOSIS — L03221 Cellulitis of neck: Secondary | ICD-10-CM | POA: Diagnosis not present

## 2020-06-22 DIAGNOSIS — H9209 Otalgia, unspecified ear: Secondary | ICD-10-CM | POA: Diagnosis present

## 2020-06-22 LAB — CBC
HCT: 44 % (ref 39.0–52.0)
Hemoglobin: 15.1 g/dL (ref 13.0–17.0)
MCH: 30.6 pg (ref 26.0–34.0)
MCHC: 34.3 g/dL (ref 30.0–36.0)
MCV: 89.1 fL (ref 80.0–100.0)
Platelets: 250 10*3/uL (ref 150–400)
RBC: 4.94 MIL/uL (ref 4.22–5.81)
RDW: 12.3 % (ref 11.5–15.5)
WBC: 7.5 10*3/uL (ref 4.0–10.5)
nRBC: 0 % (ref 0.0–0.2)

## 2020-06-22 LAB — BASIC METABOLIC PANEL
Anion gap: 10 (ref 5–15)
BUN: 10 mg/dL (ref 6–20)
CO2: 27 mmol/L (ref 22–32)
Calcium: 9.3 mg/dL (ref 8.9–10.3)
Chloride: 101 mmol/L (ref 98–111)
Creatinine, Ser: 0.85 mg/dL (ref 0.61–1.24)
GFR, Estimated: >60 mL/min (ref >60)
Glucose, Bld: 98 mg/dL (ref 70–99)
Potassium: 4.2 mmol/L (ref 3.5–5.1)
Sodium: 138 mmol/L (ref 135–145)

## 2020-06-22 MED ORDER — SULFAMETHOXAZOLE-TRIMETHOPRIM 800-160 MG PO TABS: 1.0000 | ORAL_TABLET | Freq: Two times a day (BID) | ORAL | 0 refills | Status: AC

## 2020-06-22 MED ORDER — CEPHALEXIN 500 MG PO CAPS: 500.0000 mg | ORAL_CAPSULE | Freq: Two times a day (BID) | ORAL | 0 refills | Status: AC

## 2020-06-22 MED ORDER — SULFAMETHOXAZOLE-TRIMETHOPRIM 800-160 MG PO TABS
1.0000 | ORAL_TABLET | Freq: Once | ORAL | Status: AC
  Administered 2020-06-22: 1 via ORAL
  Filled 2020-06-22: qty 1

## 2020-06-22 MED ORDER — CEPHALEXIN 500 MG PO CAPS
500.0000 mg | ORAL_CAPSULE | Freq: Once | ORAL | Status: AC
  Administered 2020-06-22: 500 mg via ORAL
  Filled 2020-06-22: qty 1

## 2020-06-22 MED ORDER — LIDOCAINE HCL (PF) 1 % IJ SOLN
5.0000 mL | Freq: Once | INTRAMUSCULAR | Status: AC
  Administered 2020-06-22: 5 mL
  Filled 2020-06-22: qty 5

## 2020-06-22 NOTE — ED Notes (Signed)
Bleeding controlled at this time. Gauze placed over wound and tegaderm placed on top to secure dressing.

## 2020-06-22 NOTE — ED Triage Notes (Signed)
Pt presents to ED via POV with c/o abscess to lower ear lob x 2-3 days. Pt with noted swelling that extends from L ear lobe down his neck. Pt states pain worsens with turning of his head.
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
## 2021-01-22 ENCOUNTER — Other Ambulatory Visit: Payer: Self-pay

## 2021-01-22 ENCOUNTER — Emergency Department
Admission: EM | Admit: 2021-01-22 | Discharge: 2021-01-22 | Disposition: A | Discharge status: Home / Self Care | Payer: Medicaid Other | Attending: Emergency Medicine | Admitting: Emergency Medicine

## 2021-01-22 DIAGNOSIS — J45909 Unspecified asthma, uncomplicated: Secondary | ICD-10-CM | POA: Insufficient documentation

## 2021-01-22 DIAGNOSIS — F17210 Nicotine dependence, cigarettes, uncomplicated: Secondary | ICD-10-CM | POA: Insufficient documentation

## 2021-01-22 DIAGNOSIS — Z7982 Long term (current) use of aspirin: Secondary | ICD-10-CM | POA: Insufficient documentation

## 2021-01-22 DIAGNOSIS — J020 Streptococcal pharyngitis: Secondary | ICD-10-CM | POA: Diagnosis not present

## 2021-01-22 DIAGNOSIS — J029 Acute pharyngitis, unspecified: Secondary | ICD-10-CM | POA: Diagnosis present

## 2021-01-22 LAB — CBC WITH DIFFERENTIAL/PLATELET
Abs Immature Granulocytes: 0.05 10*3/uL (ref 0.00–0.07)
Basophils Absolute: 0 10*3/uL (ref 0.0–0.1)
Basophils Relative: 0 %
Eosinophils Absolute: 0 10*3/uL (ref 0.0–0.5)
Eosinophils Relative: 0 %
HCT: 42 % (ref 39.0–52.0)
Hemoglobin: 14.6 g/dL (ref 13.0–17.0)
Immature Granulocytes: 0 %
Lymphocytes Relative: 8 %
Lymphs Abs: 1.1 10*3/uL (ref 0.7–4.0)
MCH: 30.7 pg (ref 26.0–34.0)
MCHC: 34.8 g/dL (ref 30.0–36.0)
MCV: 88.2 fL (ref 80.0–100.0)
Monocytes Absolute: 1.6 10*3/uL — ABNORMAL HIGH (ref 0.1–1.0)
Monocytes Relative: 12 %
Neutro Abs: 11.3 10*3/uL — ABNORMAL HIGH (ref 1.7–7.7)
Neutrophils Relative %: 80 %
Platelets: 247 10*3/uL (ref 150–400)
RBC: 4.76 MIL/uL (ref 4.22–5.81)
RDW: 12.7 % (ref 11.5–15.5)
WBC: 14.1 10*3/uL — ABNORMAL HIGH (ref 4.0–10.5)
nRBC: 0 % (ref 0.0–0.2)

## 2021-01-22 LAB — MONONUCLEOSIS SCREEN: Mono Screen: NEGATIVE

## 2021-01-22 LAB — BASIC METABOLIC PANEL
Anion gap: 7 (ref 5–15)
BUN: 8 mg/dL (ref 6–20)
CO2: 28 mmol/L (ref 22–32)
Calcium: 8.9 mg/dL (ref 8.9–10.3)
Chloride: 102 mmol/L (ref 98–111)
Creatinine, Ser: 1.02 mg/dL (ref 0.61–1.24)
GFR, Estimated: >60 mL/min (ref >60)
Glucose, Bld: 114 mg/dL — ABNORMAL HIGH (ref 70–99)
Potassium: 3.7 mmol/L (ref 3.5–5.1)
Sodium: 137 mmol/L (ref 135–145)

## 2021-01-22 LAB — GROUP A STREP BY PCR: Group A Strep by PCR: DETECTED — AB

## 2021-01-22 MED ORDER — AZITHROMYCIN 200 MG/5ML PO SUSR: ORAL | 0 refills | Status: DC

## 2021-01-22 MED ORDER — LIDOCAINE HCL (PF) 1 % IJ SOLN: 5.0000 mL | Freq: Once | INTRAMUSCULAR | Status: AC

## 2021-01-22 MED ORDER — LIDOCAINE HCL (PF) 1 % IJ SOLN
INTRAMUSCULAR | Status: AC
  Administered 2021-01-22: 5 mL
  Filled 2021-01-22: qty 5

## 2021-01-22 MED ORDER — CEFTRIAXONE SODIUM 1 G IJ SOLR
1.0000 g | Freq: Once | INTRAMUSCULAR | Status: AC
  Administered 2021-01-22: 1 g via INTRAMUSCULAR
  Filled 2021-01-22: qty 10

## 2021-01-22 MED ORDER — AZITHROMYCIN 250 MG PO TABS: ORAL_TABLET | ORAL | 0 refills | Status: DC

## 2021-01-22 NOTE — ED Provider Notes (Signed)
[facility] Emergency Department Provider Note  ____________________________________________   Event Date/Time   First MD Initiated Contact with Patient 01/22/21 [phone]     (approximate)  I have reviewed the triage vital signs and the nursing notes.   HISTORY  Chief Complaint Sore Throat   HPI Gregory Huerta is a 25 y.o. male presents to the ED with complaint of low-grade temp and sore throat for the last 3 days.  Patient states that pain increases with swallowing and that he feels "weak".  He is unaware of any known exposure to COVID or strep.  Patient denies any change in taste or smell, no nausea, vomiting or diarrhea.  He denies any cough, currently rates his pain as an 8 out of 10.  Patient also reports that he has been taking leftover amoxicillin since his throat began hurting to reduce the swelling in his throat.  He states the amoxicillin is not working.         Past Medical History:  Diagnosis Date  . Asthma   . Complication of anesthesia   . H/O skin graft    FROM BURNS   . PONV (postoperative nausea and vomiting)     Patient Active Problem List   Diagnosis Date Noted  . Tibia fracture 02/23/2016  . Open fracture of right tibia and fibula 02/22/2016    Past Surgical History:  Procedure Laterality Date  . IM NAILING TIBIA Right 02/23/2016  . INCISION AND DRAINAGE OF WOUND Right 02/23/2016   Procedure: IRRIGATION AND DEBRIDEMENT WOUND, skin bone and muscle of open fracture site;  Surgeon: Sheral Apley, MD;  Location: [facility] OR;  Service: Orthopedics;  Laterality: Right;  . TIBIA IM NAIL INSERTION Right 02/23/2016   Procedure: INTRAMEDULLARY (IM) NAIL TIBIAL;  Surgeon: Sheral Apley, MD;  Location: [facility] OR;  Service: Orthopedics;  Laterality: Right;    Prior to Admission medications   Medication Sig Start Date End Date Taking? Authorizing Provider  azithromycin (ZITHROMAX) 200 MG/5ML suspension Take 10 ml today, then 5 ml once a day for  4 days 01/22/21  Yes Tommi Rumps, PA-C  amphetamine-dextroamphetamine (ADDERALL XR) 25 MG 24 hr capsule Take 25 mg by mouth every morning.    [provider]  aspirin EC 325 MG tablet Take 1 tablet (325 mg total) by mouth daily. 02/24/16   Sheral Apley, MD  docusate sodium (COLACE) 100 MG capsule Take 1 capsule (100 mg total) by mouth 2 (two) times daily. 02/25/16   Sheral Apley, MD    Allergies Patient has no known allergies.  No family history on file.  Social History Social History   Tobacco Use  . Smoking status: Current Some Day Smoker    Years: 0.50    Types: Cigarettes  . Smokeless tobacco: Never Used  Substance Use Topics  . Alcohol use: No  . Drug use: No    Review of Systems Constitutional: Subjective fever/chills Eyes: No visual changes. ENT: Positive sore throat. Cardiovascular: Denies chest pain. Respiratory: Denies shortness of breath.  Negative for cough. Gastrointestinal: No abdominal pain.  No nausea, no vomiting.  No diarrhea.   Musculoskeletal: Negative for musculoskeletal pain. Skin: Negative for rash. Neurological: Negative for headaches, focal weakness or numbness. ____________________________________________   PHYSICAL EXAM:  VITAL SIGNS: ED Triage Vitals [01/22/21 0821]  Enc Vitals Group     BP      Pulse      Resp      Temp  Temp src      SpO2      Weight 160 lb (72.6 kg)     Height 6' (1.829 m)     Head Circumference      Peak Flow      Pain Score 8     Pain Loc      Pain Edu?      Excl. in GC?    Constitutional: Alert and oriented. Well appearing and in no acute distress. Eyes: Conjunctivae are normal. PERRL. EOMI. Head: Atraumatic. Nose: No congestion/rhinnorhea. Mouth/Throat: Mucous membranes are moist.  Oropharynx erythematous with right tonsil slightly enlarged but no exudate is seen.  Uvula is midline. Neck: No stridor.   Cardiovascular: Normal rate, regular rhythm. Grossly normal heart sounds.   Good peripheral circulation. Respiratory: Normal respiratory effort.  No retractions. Lungs CTAB. Gastrointestinal: Soft and nontender. No distention. Neurologic:  Normal speech and language. No gross focal neurologic deficits are appreciated.  Skin:  Skin is warm, dry and intact. No rash noted. Psychiatric: Mood and affect are normal. Speech and behavior are normal.  ____________________________________________   LABS (all labs ordered are listed, but only abnormal results are displayed)  Labs Reviewed  GROUP A STREP BY PCR - Abnormal; Notable for the following components:      Result Value   Group A Strep by PCR DETECTED (*)    All other components within normal limits  CBC WITH DIFFERENTIAL/PLATELET - Abnormal; Notable for the following components:   WBC 14.1 (*)    Neutro Abs 11.3 (*)    Monocytes Absolute 1.6 (*)    All other components within normal limits  BASIC METABOLIC PANEL - Abnormal; Notable for the following components:   Glucose, Bld 114 (*)    All other components within normal limits  MONONUCLEOSIS SCREEN    PROCEDURES  Procedure(s) performed (including Critical Care):  Procedures   ____________________________________________   INITIAL IMPRESSION / ASSESSMENT AND PLAN / ED COURSE  As part of my medical decision making, I reviewed the following data within the electronic MEDICAL RECORD NUMBER Notes from prior ED visits and [place] Controlled Substance Database  25 year old male presents to the ED with complaint of low-grade temp and throat pain for the last 3 days.  Patient has been taking leftover amoxicillin which dosage and date of the medication is unknown.  Pharynx is erythematous without exudate and uvula is midline.  Patient is able to swallow his own secretions without any difficulties and no difficulty with a cane.  He was given Rocephin 1 g IM as he states that he cannot swallow pills.  A prescription for Zithromax suspension was sent to the pharmacy with  strict instructions for him to take the entire 5 days.  We also discussed not having any antibiotics left over ever as there usually no extra medications prescribed.  He is encouraged to take Tylenol or ibuprofen as needed for his throat pain.   ____________________________________________   FINAL CLINICAL IMPRESSION(S) / ED DIAGNOSES  Final diagnoses:  Strep pharyngitis     ED Discharge Orders         Ordered    azithromycin (ZITHROMAX Z-PAK) 250 MG tablet  Status:  Discontinued        01/22/21 1117    azithromycin (ZITHROMAX) 200 MG/5ML suspension        01/22/21 1124          *Please note:  Gregory Huerta was evaluated in Emergency Department on 01/22/2021 for the symptoms described in the  history of present illness. He was evaluated in the context of the global COVID-19 pandemic, which necessitated consideration that the patient might be at risk for infection with the SARS-CoV-2 virus that causes COVID-19. Institutional protocols and algorithms that pertain to the evaluation of patients at risk for COVID-19 are in a state of rapid change based on information released by regulatory bodies including the CDC and federal and state organizations. These policies and algorithms were followed during the patient's care in the ED.  Some ED evaluations and interventions may be delayed as a result of limited staffing during and the pandemic.*   Note:  This document was prepared using Dragon voice recognition software and may include unintentional dictation errors.    Tommi Rumps, PA-C 01/22/21 1130    Shaune Pollack, MD 01/23/21 0700

## 2021-01-22 NOTE — Discharge Instructions (Addendum)
Begin taking antibiotic until completely finished.  There should not be any medication left in the bottle and you should take the medication once a day for the next 5 days.  Take Tylenol or ibuprofen every 6 hours as needed for throat pain.  Increase fluids.  Follow-up with your primary care provider or [facility] acute care if any continued problems.

## 2021-01-22 NOTE — ED Triage Notes (Signed)
Pt c/o sore throat for the past 2-3 days

## 2021-01-22 NOTE — ED Notes (Signed)
See triage note  Presents with low grade temp and sore throat for 2-3 days  States pain increased this am

## 2021-02-01 ENCOUNTER — Emergency Department
Admission: EM | Admit: 2021-02-01 | Discharge: 2021-02-01 | Disposition: A | Discharge status: Home / Self Care | Payer: Medicaid Other | Attending: Emergency Medicine | Admitting: Emergency Medicine

## 2021-02-01 ENCOUNTER — Encounter: Payer: Self-pay | Admitting: Emergency Medicine

## 2021-02-01 ENCOUNTER — Other Ambulatory Visit: Payer: Self-pay

## 2021-02-01 DIAGNOSIS — R1310 Dysphagia, unspecified: Secondary | ICD-10-CM | POA: Insufficient documentation

## 2021-02-01 DIAGNOSIS — F17210 Nicotine dependence, cigarettes, uncomplicated: Secondary | ICD-10-CM | POA: Diagnosis not present

## 2021-02-01 DIAGNOSIS — J020 Streptococcal pharyngitis: Secondary | ICD-10-CM | POA: Diagnosis not present

## 2021-02-01 DIAGNOSIS — R0600 Dyspnea, unspecified: Secondary | ICD-10-CM | POA: Insufficient documentation

## 2021-02-01 DIAGNOSIS — Z7982 Long term (current) use of aspirin: Secondary | ICD-10-CM | POA: Diagnosis not present

## 2021-02-01 DIAGNOSIS — J029 Acute pharyngitis, unspecified: Secondary | ICD-10-CM | POA: Diagnosis present

## 2021-02-01 DIAGNOSIS — J45909 Unspecified asthma, uncomplicated: Secondary | ICD-10-CM | POA: Diagnosis not present

## 2021-02-01 LAB — BASIC METABOLIC PANEL
Anion gap: 10 (ref 5–15)
BUN: 10 mg/dL (ref 6–20)
CO2: 27 mmol/L (ref 22–32)
Calcium: 9.1 mg/dL (ref 8.9–10.3)
Chloride: 101 mmol/L (ref 98–111)
Creatinine, Ser: 0.86 mg/dL (ref 0.61–1.24)
GFR, Estimated: >60 mL/min (ref >60)
Glucose, Bld: 119 mg/dL — ABNORMAL HIGH (ref 70–99)
Potassium: 3.8 mmol/L (ref 3.5–5.1)
Sodium: 138 mmol/L (ref 135–145)

## 2021-02-01 LAB — CBC WITH DIFFERENTIAL/PLATELET
Abs Immature Granulocytes: 0.06 10*3/uL (ref 0.00–0.07)
Basophils Absolute: 0 10*3/uL (ref 0.0–0.1)
Basophils Relative: 0 %
Eosinophils Absolute: 0.1 10*3/uL (ref 0.0–0.5)
Eosinophils Relative: 1 %
HCT: 43.2 % (ref 39.0–52.0)
Hemoglobin: 15.2 g/dL (ref 13.0–17.0)
Immature Granulocytes: 1 %
Lymphocytes Relative: 6 %
Lymphs Abs: 0.8 10*3/uL (ref 0.7–4.0)
MCH: 31.1 pg (ref 26.0–34.0)
MCHC: 35.2 g/dL (ref 30.0–36.0)
MCV: 88.3 fL (ref 80.0–100.0)
Monocytes Absolute: 1.6 10*3/uL — ABNORMAL HIGH (ref 0.1–1.0)
Monocytes Relative: 12 %
Neutro Abs: 10.4 10*3/uL — ABNORMAL HIGH (ref 1.7–7.7)
Neutrophils Relative %: 80 %
Platelets: 289 10*3/uL (ref 150–400)
RBC: 4.89 MIL/uL (ref 4.22–5.81)
RDW: 12.6 % (ref 11.5–15.5)
WBC: 12.9 10*3/uL — ABNORMAL HIGH (ref 4.0–10.5)
nRBC: 0 % (ref 0.0–0.2)

## 2021-02-01 LAB — GROUP A STREP BY PCR: Group A Strep by PCR: DETECTED — AB

## 2021-02-01 LAB — MONONUCLEOSIS SCREEN: Mono Screen: NEGATIVE

## 2021-02-01 MED ORDER — LIDOCAINE VISCOUS HCL 2 % MT SOLN: 5.0000 mL | Freq: Four times a day (QID) | OROMUCOSAL | 0 refills | Status: DC | PRN

## 2021-02-01 MED ORDER — PREDNISONE 20 MG PO TABS
60.0000 mg | ORAL_TABLET | Freq: Once | ORAL | Status: AC
  Administered 2021-02-01: 60 mg via ORAL
  Filled 2021-02-01: qty 3

## 2021-02-01 MED ORDER — AMOXICILLIN 500 MG PO CAPS: 500.0000 mg | ORAL_CAPSULE | Freq: Three times a day (TID) | ORAL | 0 refills | Status: DC

## 2021-02-01 MED ORDER — LIDOCAINE VISCOUS HCL 2 % MT SOLN
15.0000 mL | Freq: Once | OROMUCOSAL | Status: AC
  Administered 2021-02-01: 15 mL via OROMUCOSAL
  Filled 2021-02-01: qty 15

## 2021-02-01 MED ORDER — DIPHENHYDRAMINE HCL 12.5 MG/5ML PO ELIX
25.0000 mg | ORAL_SOLUTION | Freq: Once | ORAL | Status: AC
  Administered 2021-02-01: 25 mg via ORAL
  Filled 2021-02-01: qty 10

## 2021-02-01 MED ORDER — SODIUM CHLORIDE 0.9 % IV SOLN
1.0000 g | Freq: Once | INTRAVENOUS | Status: AC
  Administered 2021-02-01: 1 g via INTRAVENOUS
  Filled 2021-02-01: qty 10

## 2021-02-01 MED ORDER — METHYLPREDNISOLONE 4 MG PO TBPK: ORAL_TABLET | ORAL | 0 refills | Status: DC

## 2021-02-01 NOTE — ED Triage Notes (Signed)
Pt c/o bilateral ear pain, sore throat, neck pain. Pt states was dx with strep throat 2 weeks ago. VSS in triage. Pt states "I feel like I could breathe last night", c/o feeling swelling in his throat, pt able to speak in full and complete sentences in triage and able to maintain his own secretions in triage.

## 2021-02-01 NOTE — Discharge Instructions (Signed)
Read and follow discharge care instruction take medication as directed.

## 2021-02-01 NOTE — ED Provider Notes (Signed)
[facility] Emergency Department Provider Note   ____________________________________________   None    (approximate)  I have reviewed the triage vital signs and the nursing notes.   HISTORY  Chief Complaint Sore Throat and Neck Pain    HPI Gregory Huerta is a 25 y.o. male patient complain of sore throat, neck pain, mild dysphagia, and dyspnea.  Patient was diagnosed with strep pharyngitis on 01/22/2021.  Patient was given 1 g of Rocephin since he stated he cannot take pills and then a prescription for Z-Pak.  Patient did not get prescription filled.  Denies fever associated complaint.         Past Medical History:  Diagnosis Date  . Asthma   . Complication of anesthesia   . H/O skin graft    FROM BURNS   . PONV (postoperative nausea and vomiting)     Patient Active Problem List   Diagnosis Date Noted  . Tibia fracture 02/23/2016  . Open fracture of right tibia and fibula 02/22/2016    Past Surgical History:  Procedure Laterality Date  . IM NAILING TIBIA Right 02/23/2016  . INCISION AND DRAINAGE OF WOUND Right 02/23/2016   Procedure: IRRIGATION AND DEBRIDEMENT WOUND, skin bone and muscle of open fracture site;  Surgeon: Sheral Apley, MD;  Location: [facility] OR;  Service: Orthopedics;  Laterality: Right;  . TIBIA IM NAIL INSERTION Right 02/23/2016   Procedure: INTRAMEDULLARY (IM) NAIL TIBIAL;  Surgeon: Sheral Apley, MD;  Location: [facility] OR;  Service: Orthopedics;  Laterality: Right;    Prior to Admission medications   Medication Sig Start Date End Date Taking? Authorizing Provider  amoxicillin (AMOXIL) 500 MG capsule Take 1 capsule (500 mg total) by mouth 3 (three) times daily. 02/01/21  Yes Joni Reining, PA-C  lidocaine (XYLOCAINE) 2 % solution Use as directed 5 mLs in the mouth or throat every 6 (six) hours as needed for mouth pain. For swish and swallow 02/01/21  Yes Joni Reining, PA-C  methylPREDNISolone (MEDROL DOSEPAK) 4 MG TBPK  tablet Take Tapered dose as directed 02/01/21  Yes Joni Reining, PA-C  amphetamine-dextroamphetamine (ADDERALL XR) 25 MG 24 hr capsule Take 25 mg by mouth every morning.    [provider]  aspirin EC 325 MG tablet Take 1 tablet (325 mg total) by mouth daily. 02/24/16   Sheral Apley, MD  docusate sodium (COLACE) 100 MG capsule Take 1 capsule (100 mg total) by mouth 2 (two) times daily. 02/25/16   Sheral Apley, MD    Allergies Patient has no known allergies.  History reviewed. No pertinent family history.  Social History Social History   Tobacco Use  . Smoking status: Current Some Day Smoker    Years: 0.50    Types: Cigarettes  . Smokeless tobacco: Never Used  Substance Use Topics  . Alcohol use: No  . Drug use: No    Review of Systems Constitutional: No fever/chills Eyes: No visual changes. ENT: Sore throat.  Cardiovascular: Denies chest pain. Respiratory: Denies shortness of breath. Gastrointestinal: No abdominal pain.  No nausea, no vomiting.  No diarrhea.  No constipation. Genitourinary: Negative for dysuria. Musculoskeletal: Negative for back pain. Skin: Negative for rash. Neurological: Negative for headaches, focal weakness or numbness.   ____________________________________________   PHYSICAL EXAM:  VITAL SIGNS: ED Triage Vitals  Enc Vitals Group     BP 02/01/21 0707 122/65     Pulse Rate 02/01/21 0707 93     Resp 02/01/21 0707  20     Temp 02/01/21 0707 99.3 F (37.4 C)     Temp Source 02/01/21 0707 Oral     SpO2 02/01/21 0707 99 %     Weight 02/01/21 0708 161 lb (73 kg)     Height 02/01/21 0708 6' (1.829 m)     Head Circumference --      Peak Flow --      Pain Score 02/01/21 0708 10     Pain Loc --      Pain Edu? --      Excl. in GC? --    Constitutional: Alert and oriented. Well appearing and in no acute distress. Eyes: Conjunctivae are normal. PERRL. EOMI. Head: Atraumatic. Nose: No congestion/rhinnorhea. Mouth/Throat: Mucous  membranes are moist.  Oropharynx non-erythematous.  Edematous with nonexudative tonsils. Neck: No stridor.  Hematological/Lymphatic/Immunilogical: Bilateral cervical lymphadenopathy. Cardiovascular: Normal rate, regular rhythm. Grossly normal heart sounds.  Good peripheral circulation. Respiratory: Normal respiratory effort.  No retractions. Lungs CTAB. Neurologic:  Normal speech and language. No gross focal neurologic deficits are appreciated. No gait instability. Skin:  Skin is warm, dry and intact. No rash noted. Psychiatric: Mood and affect are normal. Speech and behavior are normal.  ____________________________________________   LABS (all labs ordered are listed, but only abnormal results are displayed)  Labs Reviewed  GROUP A STREP BY PCR - Abnormal; Notable for the following components:      Result Value   Group A Strep by PCR DETECTED (*)    All other components within normal limits  BASIC METABOLIC PANEL - Abnormal; Notable for the following components:   Glucose, Bld 119 (*)    All other components within normal limits  CBC WITH DIFFERENTIAL/PLATELET - Abnormal; Notable for the following components:   WBC 12.9 (*)    Neutro Abs 10.4 (*)    Monocytes Absolute 1.6 (*)    All other components within normal limits  MONONUCLEOSIS SCREEN   ____________________________________________  EKG   ____________________________________________  RADIOLOGY I, Joni Reining, personally viewed and evaluated these images (plain radiographs) as part of my medical decision making, as well as reviewing the written report by the radiologist.  ED MD interpretation:    Official radiology report(s): No results found.  ____________________________________________   PROCEDURES  Procedure(s) performed (including Critical Care):  Procedures   ____________________________________________   INITIAL IMPRESSION / ASSESSMENT AND PLAN / ED COURSE  As part of my medical decision  making, I reviewed the following data within the electronic MEDICAL RECORD NUMBER         Patient presents with present 2 weeks of sore throat.  Patient was diagnosed with strep pharyngitis 10 days ago.  Patient not take medication as directed.  Advised patient strep test again was positive.  Patient given discharge care instruction advised take medication as directed.      ____________________________________________   FINAL CLINICAL IMPRESSION(S) / ED DIAGNOSES  Final diagnoses:  Strep pharyngitis     ED Discharge Orders         Ordered    amoxicillin (AMOXIL) 500 MG capsule  3 times daily        02/01/21 0828    methylPREDNISolone (MEDROL DOSEPAK) 4 MG TBPK tablet        02/01/21 0828    lidocaine (XYLOCAINE) 2 % solution  Every 6 hours PRN        02/01/21 4196          *Please note:  Gregory Huerta was evaluated in Emergency  Department on 02/01/2021 for the symptoms described in the history of present illness. He was evaluated in the context of the global COVID-19 pandemic, which necessitated consideration that the patient might be at risk for infection with the SARS-CoV-2 virus that causes COVID-19. Institutional protocols and algorithms that pertain to the evaluation of patients at risk for COVID-19 are in a state of rapid change based on information released by regulatory bodies including the CDC and federal and state organizations. These policies and algorithms were followed during the patient's care in the ED.  Some ED evaluations and interventions may be delayed as a result of limited staffing during and the pandemic.*   Note:  This document was prepared using Dragon voice recognition software and may include unintentional dictation errors.    Joni Reining, PA-C 02/01/21 0830    Sharyn Creamer, MD 02/01/21 [phone]

## 2021-02-01 NOTE — ED Notes (Signed)
See triage note  Presents with sore throat  States he developed a sore throat about 2 weeks ago   Having increased pain with swallowing  Low grade temp on arrival   Was seen for same at that time but was not able to get rx filled

## 2021-10-14 ENCOUNTER — Encounter (HOSPITAL_COMMUNITY): Payer: Self-pay | Admitting: Emergency Medicine

## 2021-10-14 ENCOUNTER — Emergency Department (HOSPITAL_COMMUNITY)
Admission: EM | Admit: 2021-10-14 | Discharge: 2021-10-15 | Disposition: A | Discharge status: Home / Self Care | Payer: Medicaid Other | Attending: Emergency Medicine | Admitting: Emergency Medicine

## 2021-10-14 ENCOUNTER — Other Ambulatory Visit: Payer: Self-pay

## 2021-10-14 DIAGNOSIS — N5089 Other specified disorders of the male genital organs: Secondary | ICD-10-CM | POA: Diagnosis present

## 2021-10-14 DIAGNOSIS — Z7982 Long term (current) use of aspirin: Secondary | ICD-10-CM | POA: Insufficient documentation

## 2021-10-14 DIAGNOSIS — N489 Disorder of penis, unspecified: Secondary | ICD-10-CM

## 2021-10-14 LAB — URINALYSIS, ROUTINE W REFLEX MICROSCOPIC
Bilirubin Urine: NEGATIVE
Glucose, UA: NEGATIVE mg/dL
Hgb urine dipstick: NEGATIVE
Ketones, ur: NEGATIVE mg/dL
Nitrite: NEGATIVE
Protein, ur: NEGATIVE mg/dL
Specific Gravity, Urine: >1.03 — ABNORMAL HIGH (ref 1.005–1.030)
pH: 6 (ref 5.0–8.0)

## 2021-10-14 LAB — URINALYSIS, MICROSCOPIC (REFLEX): Squamous Epithelial / HPF: NONE SEEN (ref 0–5)

## 2021-10-14 NOTE — ED Triage Notes (Signed)
Patient reports left groin lump and "bump" at penis onset this week , no penile discharge or fever .

## 2021-10-14 NOTE — ED Provider Triage Note (Signed)
Emergency Medicine Provider Triage Evaluation Note  Gregory Huerta , a 26 y.o. male  was evaluated in triage.  Pt complains of a lesion to the penis and swelling to the left groin. Denies penile discharge. Has had unprotected intercourse with his fiance but denies other partners  Review of Systems  Positive: Groin swelling, penile lesion Negative: fever  Physical Exam  BP 137/61 (BP Location: Right Arm)    Pulse 96    Temp 99 F (37.2 C) (Oral)    Resp 18    Ht 6' (1.829 m)    Wt 74 kg    SpO2 96%    BMI 22.13 kg/m  Gen:   Awake, no distress   Resp:  Normal effort  MSK:   Moves extremities without difficulty  Other:  Superficial ulceration to the penis. Lymphadenopathy noted to the left inguinal area.   Medical Decision Making  Medically screening exam initiated at 10:13 PM.  Appropriate orders placed.  Gregory Huerta was informed that the remainder of the evaluation will be completed by another provider, this initial triage assessment does not replace that evaluation, and the importance of remaining in the ED until their evaluation is complete.     Gregory Meres, PA-C 10/14/21 2259

## 2021-10-15 ENCOUNTER — Telehealth: Payer: Medicaid Other | Admitting: Physician Assistant

## 2021-10-15 DIAGNOSIS — N489 Disorder of penis, unspecified: Secondary | ICD-10-CM | POA: Diagnosis not present

## 2021-10-15 LAB — GC/CHLAMYDIA PROBE AMP (~~LOC~~) NOT AT ARMC
Chlamydia: POSITIVE — AB
Comment: NEGATIVE
Comment: NORMAL
Neisseria Gonorrhea: NEGATIVE

## 2021-10-15 LAB — RPR
RPR Ser Ql: REACTIVE — AB
RPR Titer: 1:1 {titer}

## 2021-10-15 LAB — HIV ANTIBODY (ROUTINE TESTING W REFLEX): HIV Screen 4th Generation wRfx: NONREACTIVE

## 2021-10-15 MED ORDER — IBUPROFEN 800 MG PO TABS: 800.0000 mg | ORAL_TABLET | Freq: Three times a day (TID) | ORAL | 0 refills | Status: AC | PRN

## 2021-10-15 MED ORDER — AZITHROMYCIN 250 MG PO TABS
1000.0000 mg | ORAL_TABLET | Freq: Once | ORAL | Status: AC
  Administered 2021-10-15: 1000 mg via ORAL
  Filled 2021-10-15: qty 4

## 2021-10-15 MED ORDER — IBUPROFEN 400 MG PO TABS
400.0000 mg | ORAL_TABLET | Freq: Once | ORAL | Status: AC
  Administered 2021-10-15: 400 mg via ORAL
  Filled 2021-10-15: qty 1

## 2021-10-15 MED ORDER — PENICILLIN G BENZATHINE 1200000 UNIT/2ML IM SUSY
2.4000 10*6.[IU] | PREFILLED_SYRINGE | Freq: Once | INTRAMUSCULAR | Status: AC
  Administered 2021-10-15: 2.4 10*6.[IU] via INTRAMUSCULAR
  Filled 2021-10-15: qty 4

## 2021-10-15 NOTE — Patient Instructions (Signed)
°  Gregory Huerta, thank you for joining Piedad Climes, PA-C for today's virtual visit.  While this provider is not your primary care provider (PCP), if your PCP is located in our provider database this encounter information will be shared with them immediately following your visit.  Consent: (Patient) Gregory Huerta provided verbal consent for this virtual visit at the beginning of the encounter.  Current Medications:  Current Outpatient Medications:    aspirin EC 325 MG tablet, Take 1 tablet (325 mg total) by mouth daily. (Patient not taking: Reported on 10/14/2021), Disp: 30 tablet, Rfl: 0   lidocaine (XYLOCAINE) 2 % solution, Use as directed 5 mLs in the mouth or throat every 6 (six) hours as needed for mouth pain. For swish and swallow (Patient not taking: Reported on 10/14/2021), Disp: 100 mL, Rfl: 0   Medications ordered in this encounter:  No orders of the defined types were placed in this encounter.    *If you need refills on other medications prior to your next appointment, please contact your pharmacy*  Follow-Up: Call back or seek an in-person evaluation if the symptoms worsen or if the condition fails to improve as anticipated.  Other Instructions Please continue care as directed by the ER providers last night. I have sent in a prescription for Ibuprofen to take no more than as directed. You can take tylenol between doses of Ibuprofen if needed. The ER should contact you once your results are in.    If you have been instructed to have an in-person evaluation today at a local Urgent Care facility, please use the link below. It will take you to a list of all of our available [facility], including address, phone number and hours of operation. Please do not delay care.  [facility]  If you or a family member do not have a primary care provider, use the link below to schedule a visit and establish care. When you choose a [facility] primary care  physician or advanced practice provider, you gain a long-term partner in health. Find a Primary Care Provider  Learn more about [facility]'s in-office and virtual care options: [facility] - Get Care Now

## 2021-10-15 NOTE — Progress Notes (Signed)
Virtual Visit Consent   Gregory Huerta, you are scheduled for a virtual visit with a Gregory Huerta provider today.     Just as with appointments in the office, your consent must be obtained to participate.  Your consent will be active for this visit and any virtual visit you may have with one of our providers in the next 365 days.     If you have a MyChart account, a copy of this consent can be sent to you electronically.  All virtual visits are billed to your insurance company just like a traditional visit in the office.    As this is a virtual visit, video technology does not allow for your provider to perform a traditional examination.  This may limit your provider's ability to fully assess your condition.  If your provider identifies any concerns that need to be evaluated in person or the need to arrange testing (such as labs, EKG, etc.), we will make arrangements to do so.     Although advances in technology are sophisticated, we cannot ensure that it will always work on either your end or our end.  If the connection with a video visit is poor, the visit may have to be switched to a telephone visit.  With either a video or telephone visit, we are not always able to ensure that we have a secure connection.     I need to obtain your verbal consent now.   Are you willing to proceed with your visit today?    Gregory Huerta has provided verbal consent on 10/15/2021 for a virtual visit (video or telephone).   Gregory Huerta, [place]   Date: 10/15/2021 10:28 AM   Virtual Visit via Video Note   I, Gregory Huerta, connected with  Gregory Huerta  ([id number], [birth year]) on 10/15/21 at 10:30 AM EST by a video-enabled telemedicine application and verified that I am speaking with the correct person using two identifiers.  Location: Patient: Virtual Visit Location Patient: Home Provider: Virtual Visit Location Provider: Home Office   I discussed the limitations of evaluation and management by  telemedicine and the availability of in person appointments. The patient expressed understanding and agreed to proceed.    History of Present Illness: Gregory Huerta is a 26 y.o. who identifies as a male who was assigned male at birth, and is being seen today for request of a script for Ibuprofen to help with pain. Was seen at one of our ER last night for penile lesion that was painful. Ulceration noted by EDP. Question syphillis, versus chancroid versus other cause. Was empirically treated with IV penicillin and Azithromycin while waiting on lab results. HIV negative. UA unremarkable. His RPR, GC/chlamydia, HSV panels are pending. Notes still having pain at the area and was not given a script for ibuprofen he was supposed to receive. Denies any new or worsening symptoms.   HPI: HPI  Problems:  Patient Active Problem List   Diagnosis Date Noted   Tibia fracture 02/23/2016   Open fracture of right tibia and fibula 02/22/2016    Allergies: No Known Allergies Medications: No current outpatient medications on file.  Observations/Objective: Patient is well-developed, well-nourished in no acute distress.  Resting comfortably at home.  Head is normocephalic, atraumatic.  No labored breathing. Speech is clear and coherent with logical content.  Patient is alert and oriented at baseline.  Unable to visualize area of concern over video giving sensitive matter and no chaperone present. Was evaluated last night with penile  ulceration noted by provider without any signs of herpetic lesions, oh phimosis/paraphimosis.   Assessment and Plan: 1. Penile lesion  Unclear etiology. Labs pending from ER visit last night. No new or worsening symptoms. Already preemptively treated for syphilis and Chlamydia. He is refraining from sexual activity as instructed by EDP. Will add on script for Ibuprofen 800 mg. Can use OTC topical lidocaine to help numb tenderness at area until Ibuprofen kicks in. Strict repeat ER  assessment precautions discussed as he does not have PCP.   Follow Up Instructions: I discussed the assessment and treatment plan with the patient. The patient was provided an opportunity to ask questions and all were answered. The patient agreed with the plan and demonstrated an understanding of the instructions.  A copy of instructions were sent to the patient via MyChart unless otherwise noted below.   The patient was advised to call back or seek an in-person evaluation if the symptoms worsen or if the condition fails to improve as anticipated.  Time:  I spent 10 minutes with the patient via telehealth technology discussing the above problems/concerns.    Gregory Rio, PA-C

## 2021-10-15 NOTE — Discharge Instructions (Addendum)
Please avoid any sexual activity or contact until you are evaluated by infectious disease and all your symptoms are resolved Your significant other can follow-up with the [facility] on [address] or at the health department

## 2021-10-15 NOTE — ED Provider Notes (Signed)
[facility] EMERGENCY DEPARTMENT Provider Note   CSN: [id number] Arrival date & time: 10/14/21  1957     History  Chief Complaint - penile lesion   Gregory Huerta is a 26 y.o. male.  The history is provided by the patient.  Patient reports penile lesion and swelling in his groin over the past week.  It is worsening.  No fevers or vomiting.  No dysuria.  No penile discharge. He has never had this before. He reports he is in a monogamous relationship with his fiance. He reports he receives oral sex and engages in vaginal sex.  He does not engage in anal intercourse. Last sexual contact was within the last 24 hours    Home Medications Prior to Admission medications   Medication Sig Start Date End Date Taking? Authorizing Provider  aspirin EC 325 MG tablet Take 1 tablet (325 mg total) by mouth daily. Patient not taking: Reported on 10/14/2021 02/24/16   Sheral Apley, MD  lidocaine (XYLOCAINE) 2 % solution Use as directed 5 mLs in the mouth or throat every 6 (six) hours as needed for mouth pain. For swish and swallow Patient not taking: Reported on 10/14/2021 02/01/21   Joni Reining, PA-C      Allergies    Patient has no known allergies.    Review of Systems   Review of Systems  Constitutional:  Negative for fever.  Genitourinary:  Positive for penile pain. Negative for penile discharge.   Physical Exam Updated Vital Signs BP 110/76    Pulse 68    Temp 98.4 F (36.9 C) (Oral)    Resp 17    Ht 1.829 m (6')    Wt 74 kg    SpO2 98%    BMI 22.13 kg/m  Physical Exam CONSTITUTIONAL: Well developed/well nourished HEAD: Normocephalic/atraumatic EYES: EOMI NECK: supple no meningeal signs ABDOMEN: soft GU: Ulceration noted to the penis.  No signs of penile discharge.  There are no herpetic ulcers.  Mild left inguinal lymphadenopathy.  There is no scrotal tenderness or erythema.  No testicular tenderness or enlargement Nurse Aundra Millet present for exam NEURO: Pt is  awake/alert/appropriate, moves all extremitiesx4.  No facial droop.   EXTREMITIES: full ROM SKIN: warm, color normal PSYCH: no abnormalities of mood noted, alert and oriented to situation  ED Results / Procedures / Treatments   Labs (all labs ordered are listed, but only abnormal results are displayed) Labs Reviewed  URINALYSIS, ROUTINE W REFLEX MICROSCOPIC - Abnormal; Notable for the following components:      Result Value   Specific Gravity, Urine >1.030 (*)    Leukocytes,Ua TRACE (*)    All other components within normal limits  URINALYSIS, MICROSCOPIC (REFLEX) - Abnormal; Notable for the following components:   Bacteria, UA RARE (*)    All other components within normal limits  HSV CULTURE AND TYPING  HIV ANTIBODY (ROUTINE TESTING W REFLEX)  RPR  GC/CHLAMYDIA PROBE AMP ([place]) NOT AT [facility]    EKG None  Radiology No results found.  Procedures Procedures    Medications Ordered in ED Medications  ibuprofen (ADVIL) tablet 400 mg (400 mg Oral Given 10/15/21 0150)  azithromycin (ZITHROMAX) tablet 1,000 mg (1,000 mg Oral Given 10/15/21 0231)  penicillin g benzathine (BICILLIN LA) 1200000 UNIT/2ML injection 2.4 Million Units (2.4 Million Units Intramuscular Given 10/15/21 0231)    ED Course/ Medical Decision Making/ A&P  Medical Decision Making Differential diagnosis includes syphilis, HSV, chancroid Patient be empirically treated with penicillin and azithromycin per up-to-date guidelines. HIV is negative.  RPR/GC/chlamydia/HSV are pending He will be referred to the infectious disease clinic for further evaluation.  He was given strict instructions on avoiding all sexual activity.  He also asked for information for his fiance to be evaluated, information for the women Center and health department were given        Final Clinical Impression(s) / ED Diagnoses Final diagnoses:  Penile lesion    Rx / DC Orders ED Discharge Orders      None         Zadie Rhine, MD 10/15/21 [phone]

## 2021-10-17 LAB — HSV CULTURE AND TYPING

## 2021-10-18 ENCOUNTER — Telehealth: Payer: Medicaid Other | Admitting: Nurse Practitioner

## 2021-10-18 DIAGNOSIS — A64 Unspecified sexually transmitted disease: Secondary | ICD-10-CM

## 2021-10-18 LAB — T.PALLIDUM AB, TOTAL: T Pallidum Abs: REACTIVE — AB

## 2021-10-18 NOTE — Progress Notes (Signed)
Virtual Visit Consent   Gregory Huerta, you are scheduled for a virtual visit with a [facility] provider today.     Just as with appointments in the office, your consent must be obtained to participate.  Your consent will be active for this visit and any virtual visit you may have with one of our providers in the next 365 days.     If you have a MyChart account, a copy of this consent can be sent to you electronically.  All virtual visits are billed to your insurance company just like a traditional visit in the office.    As this is a virtual visit, video technology does not allow for your provider to perform a traditional examination.  This may limit your provider's ability to fully assess your condition.  If your provider identifies any concerns that need to be evaluated in person or the need to arrange testing (such as labs, EKG, etc.), we will make arrangements to do so.     Although advances in technology are sophisticated, we cannot ensure that it will always work on either your end or our end.  If the connection with a video visit is poor, the visit may have to be switched to a telephone visit.  With either a video or telephone visit, we are not always able to ensure that we have a secure connection.     I need to obtain your verbal consent now.   Are you willing to proceed with your visit today?    Gregory Huerta has provided verbal consent on 10/18/2021 for a virtual visit (video or telephone).   Gregory Simas, FNP   Date: 10/18/2021 8:19 AM   Virtual Visit via Video Note   I, Gregory Huerta, connected with  Gregory Huerta  ([id number], 07-Jul-1996) on 10/18/21 at  8:45 AM EST by a video-enabled telemedicine application and verified that I am speaking with the correct person using two identifiers.  Location: Patient: Virtual Visit Location Patient: Home Provider: Virtual Visit Location Provider: Home Office   I discussed the limitations of evaluation and management by telemedicine and  the availability of in person appointments. The patient expressed understanding and agreed to proceed.    History of Present Illness: Gregory Huerta is a 26 y.o. who identifies as a male who was assigned male at birth, and is being seen today with questions about recent positive STI results from ED.   He was seen 10/14/21 with a penile lesion  Chlamydia positive  RPR positive awaiting T.pallidum final results   He continues to have pain at lesion site on penis.  He was given an appointment with infectious disease but appointment not viewable in Epic and patient unsure of the date of appointment    Problems:  Patient Active Problem List   Diagnosis Date Noted   Tibia fracture 02/23/2016   Open fracture of right tibia and fibula 02/22/2016    Allergies: No Known Allergies Medications:  Current Outpatient Medications:    ibuprofen (ADVIL) 800 MG tablet, Take 1 tablet (800 mg total) by mouth every 8 (eight) hours as needed., Disp: 30 tablet, Rfl: 0  Observations/Objective: Patient is well-developed, well-nourished in no acute distress.  Resting comfortably  at home.  Head is normocephalic, atraumatic.  No labored breathing.  Speech is clear and coherent with logical content.  Patient is alert and oriented at baseline.    Assessment and Plan: 1. STI (sexually transmitted infection) Instructed the patient to call the Health Department for management.  Gave the patient both [facility] and [facility] Department numbers   He will call and schedule an appointment for ongoing management with his local health deparment Advised STI results will be viewable through Mychart      Follow Up Instructions: I discussed the assessment and treatment plan with the patient. The patient was provided an opportunity to ask questions and all were answered. The patient agreed with the plan and demonstrated an understanding of the instructions.  A copy of instructions were sent to the patient via  MyChart unless otherwise noted below.    The patient was advised to call back or seek an in-person evaluation if the symptoms worsen or if the condition fails to improve as anticipated.  Time:  I spent 10 minutes with the patient via telehealth technology discussing the above problems/concerns.    Gregory Simas, FNP

## 2024-03-20 ENCOUNTER — Other Ambulatory Visit: Payer: Self-pay

## 2024-03-20 ENCOUNTER — Emergency Department (HOSPITAL_COMMUNITY)
Admission: EM | Admit: 2024-03-20 | Discharge: 2024-03-20 | Disposition: A | Discharge status: Home / Self Care | Attending: Student in an Organized Health Care Education/Training Program | Admitting: Student in an Organized Health Care Education/Training Program

## 2024-03-20 ENCOUNTER — Encounter (HOSPITAL_COMMUNITY): Payer: Self-pay | Admitting: Pharmacy Technician

## 2024-03-20 DIAGNOSIS — M791 Myalgia, unspecified site: Secondary | ICD-10-CM | POA: Insufficient documentation

## 2024-03-20 DIAGNOSIS — M7918 Myalgia, other site: Secondary | ICD-10-CM

## 2024-03-20 DIAGNOSIS — Y92410 Unspecified street and highway as the place of occurrence of the external cause: Secondary | ICD-10-CM | POA: Diagnosis not present

## 2024-03-20 DIAGNOSIS — M545 Low back pain, unspecified: Secondary | ICD-10-CM | POA: Diagnosis present

## 2024-03-20 MED ORDER — CYCLOBENZAPRINE HCL 10 MG PO TABS
10.0000 mg | ORAL_TABLET | Freq: Once | ORAL | Status: AC
Start: 2024-03-20 — End: 2024-03-20
  Administered 2024-03-20: 10 mg via ORAL
  Filled 2024-03-20: qty 1

## 2024-03-20 MED ORDER — IBUPROFEN 400 MG PO TABS
600.0000 mg | ORAL_TABLET | Freq: Once | ORAL | Status: AC
  Administered 2024-03-20: 600 mg via ORAL
  Filled 2024-03-20: qty 1

## 2024-03-20 NOTE — Discharge Instructions (Signed)
 You were seen in the emergency department today for an urgent medical evaluation.  Your workup did not show any abnormalities that warranted hospital admission at this time.  However, we highly encourage you to follow-up with your primary care physician for reevaluation and discussion of your recent ER visit.   We recommend you return to the emergency department if you develop any severe chest pain, respiratory distress, significant worsening of your current symptoms, or you have any other questions/concerns. Thank you for trusting [place] with your care.
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
